# Patient Record
Sex: Male | Born: 1985 | Race: Black or African American | Hispanic: No | Marital: Single | Smoking: Former smoker
Health system: Southern US, Community
[De-identification: ages and names within clinical notes are randomized; demographics above are authoritative.]

## PROBLEM LIST (undated history)

## (undated) DIAGNOSIS — M4310 Spondylolisthesis, site unspecified: Secondary | ICD-10-CM

## (undated) HISTORY — PX: NOSE SURGERY: SHX723

---

## 2006-11-29 ENCOUNTER — Emergency Department: Payer: Self-pay | Admitting: Emergency Medicine

## 2010-10-19 ENCOUNTER — Emergency Department: Payer: Self-pay | Admitting: Emergency Medicine

## 2010-12-23 ENCOUNTER — Emergency Department: Payer: Self-pay | Admitting: Internal Medicine

## 2016-06-17 DIAGNOSIS — N5089 Other specified disorders of the male genital organs: Secondary | ICD-10-CM | POA: Insufficient documentation

## 2016-06-17 DIAGNOSIS — J302 Other seasonal allergic rhinitis: Secondary | ICD-10-CM | POA: Insufficient documentation

## 2019-08-27 ENCOUNTER — Other Ambulatory Visit: Payer: Self-pay | Admitting: *Deleted

## 2019-08-27 DIAGNOSIS — Z20822 Contact with and (suspected) exposure to covid-19: Secondary | ICD-10-CM

## 2019-08-29 LAB — NOVEL CORONAVIRUS, NAA: SARS-CoV-2, NAA: NOT DETECTED

## 2019-09-11 ENCOUNTER — Other Ambulatory Visit: Payer: Self-pay

## 2019-09-11 DIAGNOSIS — Z20822 Contact with and (suspected) exposure to covid-19: Secondary | ICD-10-CM

## 2019-09-12 LAB — NOVEL CORONAVIRUS, NAA: SARS-CoV-2, NAA: NOT DETECTED

## 2019-11-09 ENCOUNTER — Emergency Department
Admission: EM | Admit: 2019-11-09 | Discharge: 2019-11-09 | Disposition: A | Discharge status: Home / Self Care | Payer: Worker's Compensation | Attending: Emergency Medicine | Admitting: Emergency Medicine

## 2019-11-09 ENCOUNTER — Other Ambulatory Visit: Payer: Self-pay

## 2019-11-09 ENCOUNTER — Emergency Department: Payer: Worker's Compensation

## 2019-11-09 ENCOUNTER — Encounter: Payer: Self-pay | Admitting: Emergency Medicine

## 2019-11-09 DIAGNOSIS — Y990 Civilian activity done for income or pay: Secondary | ICD-10-CM | POA: Insufficient documentation

## 2019-11-09 DIAGNOSIS — S39012A Strain of muscle, fascia and tendon of lower back, initial encounter: Secondary | ICD-10-CM | POA: Insufficient documentation

## 2019-11-09 DIAGNOSIS — Z880 Allergy status to penicillin: Secondary | ICD-10-CM | POA: Diagnosis not present

## 2019-11-09 DIAGNOSIS — X500XXA Overexertion from strenuous movement or load, initial encounter: Secondary | ICD-10-CM | POA: Diagnosis not present

## 2019-11-09 DIAGNOSIS — R197 Diarrhea, unspecified: Secondary | ICD-10-CM | POA: Insufficient documentation

## 2019-11-09 DIAGNOSIS — Y9389 Activity, other specified: Secondary | ICD-10-CM | POA: Diagnosis not present

## 2019-11-09 DIAGNOSIS — S3992XA Unspecified injury of lower back, initial encounter: Secondary | ICD-10-CM | POA: Diagnosis present

## 2019-11-09 MED ORDER — OXYCODONE-ACETAMINOPHEN 7.5-325 MG PO TABS: 1.0000 | ORAL_TABLET | Freq: Four times a day (QID) | ORAL | 0 refills | Status: DC | PRN

## 2019-11-09 MED ORDER — DIPHENOXYLATE-ATROPINE 2.5-0.025 MG PO TABS: 1.0000 | ORAL_TABLET | Freq: Four times a day (QID) | ORAL | 0 refills | Status: DC | PRN

## 2019-11-09 MED ORDER — CYCLOBENZAPRINE HCL 10 MG PO TABS: 10.0000 mg | ORAL_TABLET | Freq: Three times a day (TID) | ORAL | 0 refills | Status: DC | PRN

## 2019-11-09 MED ORDER — LIDOCAINE 5 % EX PTCH
2.0000 | MEDICATED_PATCH | CUTANEOUS | Status: DC
  Administered 2019-11-09: 2 via TRANSDERMAL
  Filled 2019-11-09: qty 2

## 2019-11-09 NOTE — ED Notes (Signed)
Patient states not able to go yet ,will try again.

## 2019-11-09 NOTE — ED Triage Notes (Signed)
Says onset back pain when he bent to pick something up at work.  Then diarrhea started.

## 2019-11-09 NOTE — ED Provider Notes (Signed)
[facility] Emergency Department Provider Note   ____________________________________________   First MD Initiated Contact with Patient 11/09/19 1109     (approximate)  I have reviewed the triage vital signs and the nursing notes.   HISTORY  Chief Complaint Back Pain and Diarrhea    HPI Roger Golden is a 34 y.o. male patient presents acute onset of bilateral back pain secondary to flexion lifting incident at work.  Patient states also have diarrhea which started today.  Patient denies radicular component to his back pain.  Patient denies bladder or bowel dysfunction.  Patient stated at home last night with family no one else was having diarrhea.  Patient rates his pain as 8/10.  Patient described the pain as "achy/spasmatic".  No palliative measure for complaints.      History reviewed. No pertinent past medical history.  There are no problems to display for this patient.   History reviewed. No pertinent surgical history.  Prior to Admission medications   Medication Sig Start Date End Date Taking? Authorizing Provider  cyclobenzaprine (FLEXERIL) 10 MG tablet Take 1 tablet (10 mg total) by mouth 3 (three) times daily as needed. 11/09/19   Sable Feil, PA-C  diphenoxylate-atropine (LOMOTIL) 2.5-0.025 MG tablet Take 1 tablet by mouth 4 (four) times daily as needed for diarrhea or loose stools. 11/09/19 11/08/20  Sable Feil, PA-C  oxyCODONE-acetaminophen (PERCOCET) 7.5-325 MG tablet Take 1 tablet by mouth every 6 (six) hours as needed. 11/09/19   Sable Feil, PA-C    Allergies Amoxicillin  No family history on file.  Social History Social History   Tobacco Use  . Smoking status: Never Smoker  . Smokeless tobacco: Never Used  Substance Use Topics  . Alcohol use: Not Currently  . Drug use: Not on file    Review of Systems  Constitutional: No fever/chills Eyes: No visual changes. ENT: No sore throat. Cardiovascular: Denies chest  pain. Respiratory: Denies shortness of breath. Gastrointestinal: No abdominal pain.  No nausea, no vomiting.  2 episodes of loose stools this morning.  No constipation. Genitourinary: Negative for dysuria. Musculoskeletal: Positive for back pain. Skin: Negative for rash. Neurological: Negative for headaches, focal weakness or numbness. Allergic/Immunilogical: Amoxil  ____________________________________________   PHYSICAL EXAM:  VITAL SIGNS: ED Triage Vitals [11/09/19 1057]  Enc Vitals Group     BP      Pulse      Resp      Temp      Temp src      SpO2      Weight 165 lb (74.8 kg)     Height 5\' 11"  (1.803 m)     Head Circumference      Peak Flow      Pain Score 8     Pain Loc      Pain Edu?      Excl. in [place]?     Constitutional: Alert and oriented. Well appearing and in no acute distress. Neck: No stridor.   Cardiovascular: Normal rate, regular rhythm. Grossly normal heart sounds.  Good peripheral circulation. Respiratory: Normal respiratory effort.  No retractions. Lungs CTAB. Gastrointestinal: Soft and nontender. No distention. No abdominal bruits. No CVA tenderness. Musculoskeletal: No obvious spinal deformity.  Patient sits and stands with her reliance on upper extremities.  No guarding palpation of spinal processes.  Patient decreased range of motion with flexion and lateral movements.  Patient has bilateral paraspinal muscle spasm with lateral movements.  No lower extremity tenderness  nor edema.  No joint effusions. Neurologic:  Normal speech and language. No gross focal neurologic deficits are appreciated. No gait instability. Skin:  Skin is warm, dry and intact. No rash noted. Psychiatric: Mood and affect are normal. Speech and behavior are normal.  ____________________________________________   LABS (all labs ordered are listed, but only abnormal results are displayed)  Labs Reviewed - No data to  display ____________________________________________  EKG   ____________________________________________  RADIOLOGY  ED MD interpretation:    Official radiology report(s): DG Lumbar Spine 2-3 Views  Result Date: 11/09/2019 CLINICAL DATA:  Back pain EXAM: LUMBAR SPINE - 2-3 VIEW COMPARISON:  None. FINDINGS: There is no evidence of lumbar spine fracture. Mild retrolisthesis at L4-L5. No significant disc space narrowing or facet hypertrophy. IMPRESSION: No acute osseous abnormality. Electronically Signed   By: Guadlupe Spanish M.D.   On: 11/09/2019 11:57    ____________________________________________   PROCEDURES  Procedure(s) performed (including Critical Care):  Procedures   ____________________________________________   INITIAL IMPRESSION / ASSESSMENT AND PLAN / ED COURSE  As part of my medical decision making, I reviewed the following data within the electronic MEDICAL RECORD NUMBER     Patient presents with bilateral low back pain secondary to a flexion lifting incident at work today.  Discussed neck x-ray findings with patient.  Patient feels exam consistent with lumbar strain.  Patient given discharge care instruction advised take medication directed.  Patient advised [facility] with [facility] if no improvement in 3 days.    Roger Golden was evaluated in Emergency Department on 11/09/2019 for the symptoms described in the history of present illness. He was evaluated in the context of the global COVID-19 pandemic, which necessitated consideration that the patient might be at risk for infection with the SARS-CoV-2 virus that causes COVID-19. Institutional protocols and algorithms that pertain to the evaluation of patients at risk for COVID-19 are in a state of rapid change based on information released by regulatory bodies including the CDC and federal and state organizations. These policies and algorithms were followed during the patient's care in the ED.        ____________________________________________   FINAL CLINICAL IMPRESSION(S) / ED DIAGNOSES  Final diagnoses:  Strain of lumbar region, initial encounter  Diarrhea, unspecified type     ED Discharge Orders         Ordered    oxyCODONE-acetaminophen (PERCOCET) 7.5-325 MG tablet  Every 6 hours PRN     11/09/19 1212    cyclobenzaprine (FLEXERIL) 10 MG tablet  3 times daily PRN     11/09/19 1212    diphenoxylate-atropine (LOMOTIL) 2.5-0.025 MG tablet  4 times daily PRN     11/09/19 1213           Note:  This document was prepared using Dragon voice recognition software and may include unintentional dictation errors.    Joni Reining, PA-C 11/09/19 1222    Dionne Bucy, MD 11/09/19 [phone]

## 2019-11-09 NOTE — Discharge Instructions (Signed)
Follow discharge care instruction take medication as directed. °

## 2019-11-09 NOTE — ED Notes (Addendum)
See triage note Presents with lower back pain after bending to lift something  States he felt a sharp pain  Became nauseated  And then add an episode of diarrhea

## 2019-11-09 NOTE — ED Notes (Signed)
Patient unable to urinate at this time give patient a cup of water will check again.

## 2019-11-11 ENCOUNTER — Other Ambulatory Visit: Payer: Self-pay

## 2019-11-11 ENCOUNTER — Emergency Department: Payer: Worker's Compensation

## 2019-11-11 ENCOUNTER — Emergency Department
Admission: EM | Admit: 2019-11-11 | Discharge: 2019-11-11 | Disposition: A | Discharge status: Home / Self Care | Payer: Worker's Compensation | Attending: Student | Admitting: Student

## 2019-11-11 DIAGNOSIS — R1033 Periumbilical pain: Secondary | ICD-10-CM | POA: Insufficient documentation

## 2019-11-11 DIAGNOSIS — R112 Nausea with vomiting, unspecified: Secondary | ICD-10-CM | POA: Diagnosis not present

## 2019-11-11 DIAGNOSIS — R197 Diarrhea, unspecified: Secondary | ICD-10-CM | POA: Diagnosis not present

## 2019-11-11 DIAGNOSIS — Z20822 Contact with and (suspected) exposure to covid-19: Secondary | ICD-10-CM | POA: Insufficient documentation

## 2019-11-11 LAB — COMPREHENSIVE METABOLIC PANEL
ALT: 25 U/L (ref 0–44)
AST: 15 U/L (ref 15–41)
Albumin: 4.6 g/dL (ref 3.5–5.0)
Alkaline Phosphatase: 66 U/L (ref 38–126)
Anion gap: 8 (ref 5–15)
BUN: 14 mg/dL (ref 6–20)
CO2: 26 mmol/L (ref 22–32)
Calcium: 9.2 mg/dL (ref 8.9–10.3)
Chloride: 103 mmol/L (ref 98–111)
Creatinine, Ser: 0.97 mg/dL (ref 0.61–1.24)
GFR calc Af Amer: >60 mL/min (ref >60)
GFR calc non Af Amer: >60 mL/min (ref >60)
Glucose, Bld: 118 mg/dL — ABNORMAL HIGH (ref 70–99)
Potassium: 4.7 mmol/L (ref 3.5–5.1)
Sodium: 137 mmol/L (ref 135–145)
Total Bilirubin: 0.8 mg/dL (ref 0.3–1.2)
Total Protein: 7.4 g/dL (ref 6.5–8.1)

## 2019-11-11 LAB — CBC
HCT: 48.9 % (ref 39.0–52.0)
Hemoglobin: 16.6 g/dL (ref 13.0–17.0)
MCH: 30.7 pg (ref 26.0–34.0)
MCHC: 33.9 g/dL (ref 30.0–36.0)
MCV: 90.4 fL (ref 80.0–100.0)
Platelets: 254 10*3/uL (ref 150–400)
RBC: 5.41 MIL/uL (ref 4.22–5.81)
RDW: 12.4 % (ref 11.5–15.5)
WBC: 14.5 10*3/uL — ABNORMAL HIGH (ref 4.0–10.5)
nRBC: 0 % (ref 0.0–0.2)

## 2019-11-11 LAB — LIPASE, BLOOD: Lipase: 23 U/L (ref 11–51)

## 2019-11-11 MED ORDER — IOHEXOL 300 MG/ML  SOLN
100.0000 mL | Freq: Once | INTRAMUSCULAR | Status: AC | PRN
  Administered 2019-11-11: 100 mL via INTRAVENOUS

## 2019-11-11 MED ORDER — ONDANSETRON HCL 4 MG/2ML IJ SOLN
4.0000 mg | Freq: Once | INTRAMUSCULAR | Status: AC
  Administered 2019-11-11: 17:00:00 4 mg via INTRAVENOUS
  Filled 2019-11-11: qty 2

## 2019-11-11 MED ORDER — SODIUM CHLORIDE 0.9 % IV BOLUS
1000.0000 mL | Freq: Once | INTRAVENOUS | Status: AC
  Administered 2019-11-11: 1000 mL via INTRAVENOUS

## 2019-11-11 MED ORDER — ONDANSETRON HCL 4 MG PO TABS: 4.0000 mg | ORAL_TABLET | Freq: Three times a day (TID) | ORAL | 0 refills | Status: DC | PRN

## 2019-11-11 MED ORDER — SODIUM CHLORIDE 0.9% FLUSH: 3.0000 mL | Freq: Once | INTRAVENOUS | Status: DC

## 2019-11-11 NOTE — ED Notes (Signed)
Pt taken for CT scan 

## 2019-11-11 NOTE — ED Triage Notes (Signed)
Pt c/o generalized abd pain with N/V/D for the past week and tested negative for covid in the past week.

## 2019-11-11 NOTE — ED Notes (Signed)
Pt states he has had n/v for the last 2 weeks- pt states that he has been bloated and when he burps it "smells like sulfur"- pt states that he has not eaten anything today

## 2019-11-11 NOTE — Discharge Instructions (Signed)
Thank you for letting us take care of you in the emergency department today.  At this time, your CT scan did not show any acute abnormalities that require you to stay in the hospital.  We will send you home with a prescription to help with nausea.  We also advise some dietary adjustments at this time (see the attachment papers), as well as obtaining some over-the-counter antidiarrheal locations (such as Pepto-Bismol), and probiotics, and take as directed.  We have placed a referral, information below, for follow-up with a GI doctor for further work-up and evaluation of your symptoms.  Please call to make an appointment.  Please return to the emergency department for any new or worsening symptoms.

## 2019-11-11 NOTE — ED Provider Notes (Signed)
[facility] Emergency Department Provider Note  ____________________________________________   First MD Initiated Contact with Patient 11/11/19 1620     (approximate)  I have reviewed the triage vital signs and the nursing notes.  History  Chief Complaint Abdominal Pain    HPI Roger Golden is a 34 y.o. male with no significant medical history who presents to the emergency department for ongoing abdominal pain, nausea, vomiting, diarrhea.  Patient states symptoms first started about a week and a half ago.  Initially developed central abdominal pain, along with vomiting and diarrhea.  Pain is intermittent and comes and goes without any identifiable inciting trigger.  Sharp, severe, located periumbilically.  No radiation, no alleviating or aggravating factors.  At the initial onset of his symptoms he had associated nonbloody nonbilious emesis.  This seemed to improve briefly, however now he still has emesis with attempted PO and has had severely decreased intake as a result.  He feels like he has lost weight because of this.  Over the last week and a half he has had persistent diarrhea, at least several episodes daily, also nonbloody, no melena or hematochezia.  He denies any alcohol, tobacco, marijuana use.  Denies any history of IBD or family history of this.  No testicular pain or swelling, no dysuria, no discharge.   Past Medical Hx History reviewed. No pertinent past medical history.  Problem List There are no problems to display for this patient.   Past Surgical Hx History reviewed. No pertinent surgical history.  Medications Prior to Admission medications   Medication Sig Start Date End Date Taking? Authorizing Provider  cyclobenzaprine (FLEXERIL) 10 MG tablet Take 1 tablet (10 mg total) by mouth 3 (three) times daily as needed. 11/09/19   Sable Feil, PA-C  diphenoxylate-atropine (LOMOTIL) 2.5-0.025 MG tablet Take 1 tablet by mouth 4 (four) times  daily as needed for diarrhea or loose stools. 11/09/19 11/08/20  Sable Feil, PA-C  oxyCODONE-acetaminophen (PERCOCET) 7.5-325 MG tablet Take 1 tablet by mouth every 6 (six) hours as needed. 11/09/19   Sable Feil, PA-C    Allergies Amoxicillin  Family Hx No family history on file.  Social Hx Social History   Tobacco Use  . Smoking status: Never Smoker  . Smokeless tobacco: Never Used  Substance Use Topics  . Alcohol use: Not Currently  . Drug use: Not on file     Review of Systems  Constitutional: Negative for fever, chills. Eyes: Negative for visual changes. ENT: Negative for sore throat. Cardiovascular: Negative for chest pain. Respiratory: Negative for shortness of breath. Gastrointestinal: Positive for nausea, vomiting, diarrhea, abdominal pain.  Genitourinary: Negative for dysuria. Musculoskeletal: Negative for leg swelling. Skin: Negative for rash. Neurological: Negative for headaches.   Physical Exam  Vital Signs: ED Triage Vitals  Enc Vitals Group     BP 11/11/19 1410 123/90     Pulse Rate 11/11/19 1410 (!) 107     Resp 11/11/19 1410 16     Temp 11/11/19 1410 98.7 F (37.1 C)     Temp Source 11/11/19 1410 Oral     SpO2 11/11/19 1410 97 %     Weight 11/11/19 1352 165 lb (74.8 kg)     Height 11/11/19 1352 5\' 11"  (1.803 m)     Head Circumference --      Peak Flow --      Pain Score 11/11/19 1352 7     Pain Loc --      Pain  Edu? --      Excl. in [place]? --     Constitutional: Alert and oriented.  Head: Normocephalic. Atraumatic. Eyes: Conjunctivae clear. Sclera anicteric. Nose: No congestion. No rhinorrhea. Mouth/Throat: Wearing mask.  Neck: No stridor.   Cardiovascular: Normal rate, regular rhythm. Extremities well perfused. Respiratory: Normal respiratory effort.  Lungs CTAB. Gastrointestinal: Soft.  Mild periumbilical abdominal pain, no rebound or guarding. Non-distended.  Musculoskeletal: No lower extremity edema. No deformities. Neurologic:   Normal speech and language. No gross focal neurologic deficits are appreciated.  Skin: Skin is warm, dry and intact. No rash noted. Psychiatric: Mood and affect are appropriate for situation.   Radiology  CT: IMPRESSION:  1. No acute abnormalities involving the abdomen or pelvis.  2. Minimal atherosclerosis involving the LEFT common iliac artery at  its origin, advanced for patient age.   Stomach/Bowel: Stomach normal in appearance for the degree of  distention. Normal-appearing small bowel. Liquid stool throughout  normal appearing colon from cecum to rectum. No focal abnormality  involving the colon. Normal appendix in the RIGHT mid pelvis.    Procedures  Procedure(s) performed (including critical care):  Procedures   Initial Impression / Assessment and Plan / ED Course  34 y.o. male who presents to the ED for abdominal pain, vomiting, diarrhea, as above.  Ddx: COVID, colitis, IBD, other GI viral process  Will evaluate with labs, imaging.  CT without acute abnormalities in the abdomen or pelvis, though liquid stool is noted throughout normal appearing colon from cecum to rectum.  Will advise over-the-counter probiotics, antidiarrheals, dietary adjustment and follow-up with GI. Patient voices understanding and is agreeable. Will perform send out COVID swab prior to discharge. Given return precautions.    Final Clinical Impression(s) / ED Diagnosis  Final diagnoses:  Periumbilical abdominal pain  Nausea, vomiting, and diarrhea       Note:  This document was prepared using Dragon voice recognition software and may include unintentional dictation errors.   Miguel Aschoff., MD 11/11/19 [phone]

## 2019-11-13 ENCOUNTER — Other Ambulatory Visit: Payer: Self-pay

## 2019-11-13 ENCOUNTER — Encounter: Payer: Self-pay | Admitting: Gastroenterology

## 2019-11-13 ENCOUNTER — Ambulatory Visit: Payer: PRIVATE HEALTH INSURANCE | Admitting: Gastroenterology

## 2019-11-13 VITALS — BP 130/79 | HR 99 | Temp 98.3°F | Ht 70.0 in | Wt 157.0 lb

## 2019-11-13 DIAGNOSIS — R112 Nausea with vomiting, unspecified: Secondary | ICD-10-CM

## 2019-11-13 DIAGNOSIS — R1013 Epigastric pain: Secondary | ICD-10-CM

## 2019-11-13 DIAGNOSIS — R1010 Upper abdominal pain, unspecified: Secondary | ICD-10-CM | POA: Diagnosis not present

## 2019-11-13 LAB — NOVEL CORONAVIRUS, NAA (HOSP ORDER, SEND-OUT TO REF LAB; TAT 18-24 HRS): SARS-CoV-2, NAA: NOT DETECTED

## 2019-11-13 NOTE — Progress Notes (Addendum)
Arlyss Repress, MD [address]  [address]  [place], [place] [address]  Main: [phone]  Fax: [phone]    Gastroenterology Consultation  Referring Provider:     No ref. provider found Primary Care Physician:  Patient, No Pcp Per Primary Gastroenterologist:  Dr. Arlyss Repress Reason for Consultation: Nausea, vomiting, abdominal pain        HPI:   Roger Golden is a 34 y.o. male referred from ER for consultation & management of acute onset cough nausea, vomiting and abdominal pain Patient presented to ER on 11/11/2019 secondary to approximately 2 weeks history of upper/periumbilical abdominal pain, associated with nausea, vomiting and diarrhea.  Abdominal pain is mostly central associated with nonbloody nonbilious emesis,.  His diarrheal episodes were several daily.  He denies alcohol, tobacco and marijuana use.  His labs revealed mild leukocytosis, normal CMP and lipase.  CT abdomen and pelvis with contrast was unremarkable.  Patient was advised to try over-the-counter probiotics, antidiarrheals with dietary adjustment and referred to GI for further evaluation.  COVID-19 test came back negative.  Since patient was discharged from the ER, he did not have any bowel movement.  He does report feeling gassy, bloating, symptoms trigger with spicy foods.  He does feel nauseous.  He also lost about 11 pounds in last 2 weeks as he is not able to tolerate regular food.  He does eat outside about 2-3 times a week His mother had Helicobacter pylori infection, ? IBD Patient denies smoking or alcohol use He works as an Publishing copy   NSAIDs: None  Antiplts/Anticoagulants/Anti thrombotics: None  GI Procedures: None  History reviewed. No pertinent past medical history.  History reviewed. No pertinent surgical history.   Current Outpatient Medications:  .  cyclobenzaprine (FLEXERIL) 10 MG tablet, Take 1 tablet (10 mg total) by mouth 3 (three) times daily as needed., Disp:  15 tablet, Rfl: 0   History reviewed. No pertinent family history.   Social History   Tobacco Use  . Smoking status: Never Smoker  . Smokeless tobacco: Never Used  Substance Use Topics  . Alcohol use: Not Currently  . Drug use: Not on file    Allergies as of 11/13/2019 - Review Complete 11/13/2019  Allergen Reaction Noted  . Amoxicillin Hives 06/17/2016    Review of Systems:    All systems reviewed and negative except where noted in HPI.   Physical Exam:  BP 130/79 (BP Location: Left Arm, Patient Position: Sitting, Cuff Size: Normal)   Pulse 99   Temp 98.3 F (36.8 C) (Oral)   Ht 5\' 10"  (1.778 m)   Wt 157 lb (71.2 kg)   BMI 22.53 kg/m  No LMP for male patient.  General:   Alert, thin built, pleasant and cooperative in NAD Head:  Normocephalic and atraumatic. Eyes:  Sclera clear, no icterus.   Conjunctiva pink. Ears:  Normal auditory acuity. Nose:  No deformity, discharge, or lesions. Mouth:  No deformity or lesions,oropharynx pink & moist. Neck:  Supple; no masses or thyromegaly. Lungs:  Respirations even and unlabored.  Clear throughout to auscultation.   No wheezes, crackles, or rhonchi. No acute distress. Heart:  Regular rate and rhythm; no murmurs, clicks, rubs, or gallops. Abdomen:  Normal bowel sounds. Soft, non-tender and non-distended without masses, hepatosplenomegaly or hernias noted.  No guarding or rebound tenderness.   Rectal: Not performed Msk:  Symmetrical without gross deformities. Good, equal movement & strength bilaterally. Pulses:  Normal pulses noted. Extremities:  No  clubbing or edema.  No cyanosis. Neurologic:  Alert and oriented x3;  grossly normal neurologically. Skin:  Intact without significant lesions or rashes. No jaundice. Psych:  Alert and cooperative. Normal mood and affect.  Imaging Studies: Reviewed  Assessment and Plan:   Roger Golden is a 34 y.o. male with no significant past medical history is seen in consultation for 2  weeks history of upper abdominal pain, nausea, vomiting and nonbloody diarrhea.  CT abdomen and pelvis did not reveal any acute intra-abdominal pathology.  Labs revealed mild leukocytosis  Recommend H. pylori breath test and treat if positive If H. pylori test is negative, given his significant weight loss, recommend bidirectional endoscopy and patient is agreeable Encouraged him to eat mostly protein-based foods, avoid spicy foods and protein supplements 1-2 times daily  Follow up in 1 month   Cephas Darby, MD

## 2019-11-14 LAB — H. PYLORI BREATH TEST: H pylori Breath Test: NEGATIVE

## 2019-11-15 ENCOUNTER — Encounter: Payer: Self-pay | Admitting: Gastroenterology

## 2019-11-15 ENCOUNTER — Other Ambulatory Visit: Payer: Self-pay

## 2019-11-15 ENCOUNTER — Telehealth: Payer: Self-pay | Admitting: Gastroenterology

## 2019-11-15 DIAGNOSIS — R112 Nausea with vomiting, unspecified: Secondary | ICD-10-CM

## 2019-11-15 DIAGNOSIS — R197 Diarrhea, unspecified: Secondary | ICD-10-CM

## 2019-11-15 MED ORDER — NA SULFATE-K SULFATE-MG SULF 17.5-3.13-1.6 GM/177ML PO SOLN: 354.0000 mL | Freq: Once | ORAL | 0 refills | Status: AC

## 2019-11-15 NOTE — Telephone Encounter (Signed)
He is not doing well.  Please schedule for urgent EGD and colonoscopy Recommend further labs GI profile PCR, CRP, fecal calprotectin levels  Thanks RV

## 2019-11-15 NOTE — Telephone Encounter (Signed)
Roger Golden (girlfriend)called stating patient is sick with watery diarrhea ,bloating and burps smelling like sulfa. Please advise.

## 2019-11-15 NOTE — Addendum Note (Signed)
Addended by: Radene Knee L on: 11/15/2019 11:12 AM   Modules accepted: Orders

## 2019-11-15 NOTE — Telephone Encounter (Signed)
Order the labs.  

## 2019-11-15 NOTE — Telephone Encounter (Signed)
Called patient wife they are going to do labs today. Scheduled patient for colonoscopy and EGD for 11/20/2019. Went over instructions sent them to [employer]

## 2019-11-16 ENCOUNTER — Other Ambulatory Visit
Admission: RE | Admit: 2019-11-16 | Discharge: 2019-11-16 | Disposition: A | Discharge status: Home / Self Care | Payer: PRIVATE HEALTH INSURANCE | Source: Ambulatory Visit | Attending: Gastroenterology | Admitting: Gastroenterology

## 2019-11-16 DIAGNOSIS — Z01812 Encounter for preprocedural laboratory examination: Secondary | ICD-10-CM | POA: Insufficient documentation

## 2019-11-16 DIAGNOSIS — Z20822 Contact with and (suspected) exposure to covid-19: Secondary | ICD-10-CM | POA: Insufficient documentation

## 2019-11-16 LAB — C-REACTIVE PROTEIN: CRP: 1 mg/L (ref 0–10)

## 2019-11-16 NOTE — Discharge Instructions (Signed)
General Anesthesia, Adult, Care After This sheet gives you information about how to care for yourself after your procedure. Your health care provider may also give you more specific instructions. If you have problems or questions, contact your health care provider. What can I expect after the procedure? After the procedure, the following side effects are common:  Pain or discomfort at the IV site.  Nausea.  Vomiting.  Sore throat.  Trouble concentrating.  Feeling cold or chills.  Weak or tired.  Sleepiness and fatigue.  Soreness and body aches. These side effects can affect parts of the body that were not involved in surgery. Follow these instructions at home:  For at least 24 hours after the procedure:  Have a responsible adult stay with you. It is important to have someone help care for you until you are awake and alert.  Rest as needed.  Do not: ? Participate in activities in which you could fall or become injured. ? Drive. ? Use heavy machinery. ? Drink alcohol. ? Take sleeping pills or medicines that cause drowsiness. ? Make important decisions or sign legal documents. ? Take care of children on your own. Eating and drinking  Follow any instructions from your health care provider about eating or drinking restrictions.  When you feel hungry, start by eating small amounts of foods that are soft and easy to digest (bland), such as toast. Gradually return to your regular diet.  Drink enough fluid to keep your urine pale yellow.  If you vomit, rehydrate by drinking water, juice, or clear broth. General instructions  If you have sleep apnea, surgery and certain medicines can increase your risk for breathing problems. Follow instructions from your health care provider about wearing your sleep device: ? Anytime you are sleeping, including during daytime naps. ? While taking prescription pain medicines, sleeping medicines, or medicines that make you drowsy.  Return to  your normal activities as told by your health care provider. Ask your health care provider what activities are safe for you.  Take over-the-counter and prescription medicines only as told by your health care provider.  If you smoke, do not smoke without supervision.  Keep all follow-up visits as told by your health care provider. This is important. Contact a health care provider if:  You have nausea or vomiting that does not get better with medicine.  You cannot eat or drink without vomiting.  You have pain that does not get better with medicine.  You are unable to pass urine.  You develop a skin rash.  You have a fever.  You have redness around your IV site that gets worse. Get help right away if:  You have difficulty breathing.  You have chest pain.  You have blood in your urine or stool, or you vomit blood. Summary  After the procedure, it is common to have a sore throat or nausea. It is also common to feel tired.  Have a responsible adult stay with you for the first 24 hours after general anesthesia. It is important to have someone help care for you until you are awake and alert.  When you feel hungry, start by eating small amounts of foods that are soft and easy to digest (bland), such as toast. Gradually return to your regular diet.  Drink enough fluid to keep your urine pale yellow.  Return to your normal activities as told by your health care provider. Ask your health care provider what activities are safe for you. This information is not   intended to replace advice given to you by your health care provider. Make sure you discuss any questions you have with your health care provider. Document Revised: 09/23/2017 Document Reviewed: 05/06/2017 Elsevier Patient Education  2020 [employer]  

## 2019-11-17 LAB — SARS CORONAVIRUS 2 (TAT 6-24 HRS): SARS Coronavirus 2: NEGATIVE

## 2019-11-20 ENCOUNTER — Other Ambulatory Visit: Payer: Self-pay

## 2019-11-20 ENCOUNTER — Other Ambulatory Visit: Payer: Self-pay | Admitting: Gastroenterology

## 2019-11-20 ENCOUNTER — Encounter: Payer: Self-pay | Admitting: Gastroenterology

## 2019-11-20 ENCOUNTER — Ambulatory Visit: Payer: PRIVATE HEALTH INSURANCE | Admitting: Anesthesiology

## 2019-11-20 ENCOUNTER — Ambulatory Visit
Admission: RE | Admit: 2019-11-20 | Discharge: 2019-11-20 | Disposition: A | Discharge status: Home / Self Care | Payer: PRIVATE HEALTH INSURANCE | Attending: Gastroenterology | Admitting: Gastroenterology

## 2019-11-20 ENCOUNTER — Encounter
Admission: RE | Disposition: A | Discharge status: Home / Self Care | Payer: Self-pay | Source: Home / Self Care | Attending: Gastroenterology

## 2019-11-20 DIAGNOSIS — K644 Residual hemorrhoidal skin tags: Secondary | ICD-10-CM | POA: Diagnosis not present

## 2019-11-20 DIAGNOSIS — Z87891 Personal history of nicotine dependence: Secondary | ICD-10-CM | POA: Diagnosis not present

## 2019-11-20 DIAGNOSIS — R112 Nausea with vomiting, unspecified: Secondary | ICD-10-CM | POA: Diagnosis not present

## 2019-11-20 DIAGNOSIS — R197 Diarrhea, unspecified: Secondary | ICD-10-CM | POA: Diagnosis not present

## 2019-11-20 DIAGNOSIS — R1010 Upper abdominal pain, unspecified: Secondary | ICD-10-CM

## 2019-11-20 DIAGNOSIS — K642 Third degree hemorrhoids: Secondary | ICD-10-CM | POA: Insufficient documentation

## 2019-11-20 DIAGNOSIS — R1033 Periumbilical pain: Secondary | ICD-10-CM | POA: Insufficient documentation

## 2019-11-20 DIAGNOSIS — Z6822 Body mass index (BMI) 22.0-22.9, adult: Secondary | ICD-10-CM | POA: Insufficient documentation

## 2019-11-20 DIAGNOSIS — Z880 Allergy status to penicillin: Secondary | ICD-10-CM | POA: Insufficient documentation

## 2019-11-20 DIAGNOSIS — R634 Abnormal weight loss: Secondary | ICD-10-CM | POA: Diagnosis not present

## 2019-11-20 DIAGNOSIS — R1110 Vomiting, unspecified: Secondary | ICD-10-CM

## 2019-11-20 HISTORY — DX: Spondylolisthesis, site unspecified: M43.10

## 2019-11-20 HISTORY — PX: ESOPHAGOGASTRODUODENOSCOPY (EGD) WITH PROPOFOL: SHX5813

## 2019-11-20 HISTORY — PX: COLONOSCOPY WITH PROPOFOL: SHX5780

## 2019-11-20 HISTORY — PX: BIOPSY: SHX5522

## 2019-11-20 SURGERY — COLONOSCOPY WITH PROPOFOL
Anesthesia: General | Site: Throat

## 2019-11-20 MED ORDER — ONDANSETRON HCL 4 MG/2ML IJ SOLN: 4.0000 mg | Freq: Once | INTRAMUSCULAR | Status: DC | PRN

## 2019-11-20 MED ORDER — LACTATED RINGERS IV SOLN: INTRAVENOUS | Status: DC

## 2019-11-20 MED ORDER — LIDOCAINE HCL (CARDIAC) PF 100 MG/5ML IV SOSY
PREFILLED_SYRINGE | INTRAVENOUS | Status: DC | PRN
  Administered 2019-11-20: 50 mg via INTRAVENOUS

## 2019-11-20 MED ORDER — PROPOFOL 10 MG/ML IV BOLUS
INTRAVENOUS | Status: DC | PRN
  Administered 2019-11-20: 30 mg via INTRAVENOUS
  Administered 2019-11-20 (×2): 20 mg via INTRAVENOUS
  Administered 2019-11-20: 100 mg via INTRAVENOUS
  Administered 2019-11-20: 50 mg via INTRAVENOUS
  Administered 2019-11-20 (×2): 30 mg via INTRAVENOUS
  Administered 2019-11-20: 20 mg via INTRAVENOUS
  Administered 2019-11-20 (×2): 30 mg via INTRAVENOUS
  Administered 2019-11-20: 20 mg via INTRAVENOUS
  Administered 2019-11-20: 40 mg via INTRAVENOUS
  Administered 2019-11-20: 20 mg via INTRAVENOUS
  Administered 2019-11-20 (×3): 30 mg via INTRAVENOUS

## 2019-11-20 MED ORDER — ONDANSETRON HCL 4 MG PO TABS: 4.0000 mg | ORAL_TABLET | Freq: Three times a day (TID) | ORAL | 0 refills | Status: DC | PRN

## 2019-11-20 MED ORDER — GLYCOPYRROLATE 0.2 MG/ML IJ SOLN
INTRAMUSCULAR | Status: DC | PRN
  Administered 2019-11-20: .1 mg via INTRAVENOUS

## 2019-11-20 MED ORDER — STERILE WATER FOR IRRIGATION IR SOLN
Status: DC | PRN
  Administered 2019-11-20: 150 mL

## 2019-11-20 MED ORDER — DICYCLOMINE HCL 10 MG PO CAPS: 10.0000 mg | ORAL_CAPSULE | Freq: Three times a day (TID) | ORAL | 0 refills | Status: DC

## 2019-11-20 SURGICAL SUPPLY — 37 items
BALLN DILATOR 10-12 8 (BALLOONS)
BALLN DILATOR 12-15 8 (BALLOONS)
BALLN DILATOR 15-18 8 (BALLOONS)
BALLN DILATOR CRE 0-12 8 (BALLOONS)
BALLN DILATOR ESOPH 8 10 CRE (MISCELLANEOUS) IMPLANT
BALLOON DILATOR 12-15 8 (BALLOONS) IMPLANT
BALLOON DILATOR 15-18 8 (BALLOONS) IMPLANT
BALLOON DILATOR CRE 0-12 8 (BALLOONS) IMPLANT
BLOCK BITE 60FR ADLT L/F GRN (MISCELLANEOUS) ×4 IMPLANT
CANISTER SUCT 1200ML W/VALVE (MISCELLANEOUS) ×4 IMPLANT
CLIP HMST 235XBRD CATH ROT (MISCELLANEOUS) IMPLANT
CLIP RESOLUTION 360 11X235 (MISCELLANEOUS)
ELECT REM PT RETURN 9FT ADLT (ELECTROSURGICAL)
ELECTRODE REM PT RTRN 9FT ADLT (ELECTROSURGICAL) IMPLANT
FCP ESCP3.2XJMB 240X2.8X (MISCELLANEOUS) ×2
FORCEPS BIOP RAD 4 LRG CAP 4 (CUTTING FORCEPS) IMPLANT
FORCEPS BIOP RJ4 240 W/NDL (MISCELLANEOUS) ×2
FORCEPS ESCP3.2XJMB 240X2.8X (MISCELLANEOUS) ×2 IMPLANT
GOWN CVR UNV OPN BCK APRN NK (MISCELLANEOUS) ×4 IMPLANT
GOWN ISOL THUMB LOOP REG UNIV (MISCELLANEOUS) ×4
INJECTOR VARIJECT VIN23 (MISCELLANEOUS) IMPLANT
KIT DEFENDO VALVE AND CONN (KITS) IMPLANT
KIT ENDO PROCEDURE OLY (KITS) ×4 IMPLANT
MARKER SPOT ENDO TATTOO 5ML (MISCELLANEOUS) IMPLANT
PROBE APC STR FIRE (PROBE) IMPLANT
RETRIEVER NET PLAT FOOD (MISCELLANEOUS) IMPLANT
RETRIEVER NET ROTH 2.5X230 LF (MISCELLANEOUS) ×4 IMPLANT
SNARE COLD EXACTO (MISCELLANEOUS) IMPLANT
SNARE SHORT THROW 13M SML OVAL (MISCELLANEOUS) IMPLANT
SNARE SHORT THROW 30M LRG OVAL (MISCELLANEOUS) IMPLANT
SNARE SNG USE RND 15MM (INSTRUMENTS) IMPLANT
SPOT EX ENDOSCOPIC TATTOO (MISCELLANEOUS)
SYR INFLATION 60ML (SYRINGE) IMPLANT
TRAP ETRAP POLY (MISCELLANEOUS) IMPLANT
VARIJECT INJECTOR VIN23 (MISCELLANEOUS)
WATER STERILE IRR 250ML POUR (IV SOLUTION) ×4 IMPLANT
WIRE CRE 18-20MM 8CM F G (MISCELLANEOUS) IMPLANT

## 2019-11-20 NOTE — Anesthesia Procedure Notes (Signed)
Performed by: Trino Higinbotham, [place], CRNA Pre-anesthesia Checklist: Patient identified, Emergency Drugs available, Suction available, Timeout performed and Patient being monitored Patient Re-evaluated:Patient Re-evaluated prior to induction Oxygen Delivery Method: Nasal cannula Placement Confirmation: positive ETCO2       

## 2019-11-20 NOTE — Transfer of Care (Signed)
Immediate Anesthesia Transfer of Care Note  Patient: Roger Golden  Procedure(s) Performed: COLONOSCOPY WITH PROPOFOL (N/A Rectum) ESOPHAGOGASTRODUODENOSCOPY (EGD) WITH PROPOFOL (N/A Throat) BIOPSY (N/A Throat)  Patient Location: PACU  Anesthesia Type: General  Level of Consciousness: awake, alert  and patient cooperative  Airway and Oxygen Therapy: Patient Spontanous Breathing and Patient connected to supplemental oxygen  Post-op Assessment: Post-op Vital signs reviewed, Patient's Cardiovascular Status Stable, Respiratory Function Stable, Patent Airway and No signs of Nausea or vomiting  Post-op Vital Signs: Reviewed and stable  Complications: No apparent anesthesia complications

## 2019-11-20 NOTE — Op Note (Signed)
[facility] Gastroenterology Patient Name: Roger Golden Procedure Date: 11/20/2019 3:55 PM MRN: [id number] Account #: [id number] Date of Birth: [birth year] Admit Type: Outpatient Age: 34 Room: [facility] OR ROOM 01 Gender: Male Note Status: Finalized Procedure:             Upper GI endoscopy Indications:           Periumbilical abdominal pain, Anorexia, Diarrhea,                         Nausea with vomiting, Weight loss Providers:             Lin Landsman MD, MD Referring MD:          Rubbie Battiest. Iona Beard MD, MD (Referring MD) Medicines:             Monitored Anesthesia Care Complications:         No immediate complications. Estimated blood loss: None. Procedure:             Pre-Anesthesia Assessment:                        - Prior to the procedure, a History and Physical was                         performed, and patient medications and allergies were                         reviewed. The patient is competent. The risks and                         benefits of the procedure and the sedation options and                         risks were discussed with the patient. All questions                         were answered and informed consent was obtained.                         Patient identification and proposed procedure were                         verified by the physician, the nurse, the                         anesthesiologist, the anesthetist and the technician                         in the pre-procedure area in the procedure room in the                         endoscopy suite. Mental Status Examination: alert and                         oriented. Airway Examination: normal oropharyngeal                         airway and neck mobility. Respiratory Examination:  clear to auscultation. CV Examination: normal.                         Prophylactic Antibiotics: The patient does not require                         prophylactic antibiotics. Prior  Anticoagulants: The                         patient has taken no previous anticoagulant or                         antiplatelet agents. ASA Grade Assessment: II - A                         patient with mild systemic disease. After reviewing                         the risks and benefits, the patient was deemed in                         satisfactory condition to undergo the procedure. The                         anesthesia plan was to use monitored anesthesia care                         (MAC). Immediately prior to administration of                         medications, the patient was re-assessed for adequacy                         to receive sedatives. The heart rate, respiratory                         rate, oxygen saturations, blood pressure, adequacy of                         pulmonary ventilation, and response to care were                         monitored throughout the procedure. The physical                         status of the patient was re-assessed after the                         procedure.                        After obtaining informed consent, the endoscope was                         passed under direct vision. Throughout the procedure,                         the patient's blood pressure, pulse, and oxygen  saturations were monitored continuously. The                         Endosonoscope was introduced through the mouth, and                         advanced to the second part of duodenum. The upper GI                         endoscopy was accomplished without difficulty. The                         patient tolerated the procedure well. Findings:      The esophagus, gastroesophageal junction and examined esophagus were       normal.      The stomach was normal. Biopsies were taken with a cold forceps for       Helicobacter pylori testing.      The examined duodenum was normal. Biopsies for histology were taken with       a cold forceps for  evaluation of celiac disease. Impression:            - Normal esophagus and gastroesophageal junction.                        - Normal stomach. Biopsied.                        - Normal examined duodenum. Biopsied. Recommendation:        - Await pathology results.                        - Proceed with colonoscopy as scheduled                        See colonoscopy report Procedure Code(s):     --- Professional ---                        [phone], Esophagogastroduodenoscopy, flexible,                         transoral; with biopsy, single or multiple Diagnosis Code(s):     --- Professional ---                        L37.34, Periumbilical pain                        R63.0, Anorexia                        R19.7, Diarrhea, unspecified                        R11.2, Nausea with vomiting, unspecified                        R63.4, Abnormal weight loss CPT copyright 2019 American Medical Association. All rights reserved. The codes documented in this report are preliminary and upon coder review may  be revised to meet current compliance requirements. Dr. Ulyess Mort Lin Landsman MD, MD 11/20/2019 4:10:41 PM This report has been signed electronically.  Number of Addenda: 0 Note Initiated On: 11/20/2019 3:55 PM Total Procedure Duration: 0 hours 5 minutes 47 seconds  Estimated Blood Loss:  Estimated blood loss: none.      [facility]

## 2019-11-20 NOTE — H&P (Signed)
Cephas Darby, MD [address]  [place]  [place], [place] [address]  Main: [phone]  Fax: [phone] Pager: [phone]  Primary Care Physician:  Sharyne Peach, MD Primary Gastroenterologist:  Dr. Cephas Darby  Pre-Procedure History & Physical: HPI:  Roger Golden is a 34 y.o. male is here for an endoscopy and colonoscopy.   Past Medical History:  Diagnosis Date  . Retrolisthesis    L4-L5    Past Surgical History:  Procedure Laterality Date  . NOSE SURGERY      Prior to Admission medications   Medication Sig Start Date End Date Taking? Authorizing Provider  cyclobenzaprine (FLEXERIL) 10 MG tablet Take 1 tablet (10 mg total) by mouth 3 (three) times daily as needed. 11/09/19  Yes Sable Feil, PA-C  diphenoxylate-atropine (LOMOTIL) 2.5-0.025 MG tablet Take by mouth 4 (four) times daily as needed for diarrhea or loose stools.    [provider]  oxyCODONE-acetaminophen (PERCOCET) 7.5-325 MG tablet Take 1 tablet by mouth every 6 (six) hours as needed for severe pain.    [provider]    Allergies as of 11/15/2019 - Review Complete 11/15/2019  Allergen Reaction Noted  . Amoxicillin Hives 06/17/2016    History reviewed. No pertinent family history.  Social History   Socioeconomic History  . Marital status: Single    Spouse name: Not on file  . Number of children: Not on file  . Years of education: Not on file  . Highest education level: Not on file  Occupational History  . Not on file  Tobacco Use  . Smoking status: Former Smoker    Packs/day: 0.25    Years: 10.00    Pack years: 2.50    Types: Cigarettes    Quit date: 10/04/2019    Years since quitting: 0.1  . Smokeless tobacco: Never Used  Substance and Sexual Activity  . Alcohol use: Not Currently    Comment: may have a drink on Holidays  . Drug use: Not on file  . Sexual activity: Not on file  Other Topics Concern  . Not on file  Social History Narrative  .  Not on file   Social Determinants of Health   Financial Resource Strain:   . Difficulty of Paying Living Expenses: Not on file  Food Insecurity:   . Worried About Charity fundraiser in the Last Year: Not on file  . Ran Out of Food in the Last Year: Not on file  Transportation Needs:   . Lack of Transportation (Medical): Not on file  . Lack of Transportation (Non-Medical): Not on file  Physical Activity:   . Days of Exercise per Week: Not on file  . Minutes of Exercise per Session: Not on file  Stress:   . Feeling of Stress : Not on file  Social Connections:   . Frequency of Communication with Friends and Family: Not on file  . Frequency of Social Gatherings with Friends and Family: Not on file  . Attends Religious Services: Not on file  . Active Member of Clubs or Organizations: Not on file  . Attends Archivist Meetings: Not on file  . Marital Status: Not on file  Intimate Partner Violence:   . Fear of Current or Ex-Partner: Not on file  . Emotionally Abused: Not on file  . Physically Abused: Not on file  . Sexually Abused: Not on file    Review of Systems: See HPI, otherwise negative ROS  Physical Exam:  BP (!) 116/97   Pulse (!) 106   Temp 98.1 F (36.7 C) (Temporal)   Ht 5\' 10"  (1.778 m)   Wt 69.4 kg   SpO2 99%   BMI 21.95 kg/m  General:   Alert,  pleasant and cooperative in NAD Head:  Normocephalic and atraumatic. Neck:  Supple; no masses or thyromegaly. Lungs:  Clear throughout to auscultation.    Heart:  Regular rate and rhythm. Abdomen:  Soft, nontender and nondistended. Normal bowel sounds, without guarding, and without rebound.   Neurologic:  Alert and  oriented x4;  grossly normal neurologically.  Impression/Plan: is here for an endoscopy and colonoscopy to be performed for abdominal pain, nausea, vomiting, diarrhea  Risks, benefits, limitations, and alternatives regarding  endoscopy and colonoscopy have been reviewed with  the patient.  Questions have been answered.  All parties agreeable.   Wilburn Cornelia, MD  11/20/2019, 1:34 PM

## 2019-11-20 NOTE — Op Note (Signed)
[facility] Gastroenterology Patient Name: Roger Golden Procedure Date: 11/20/2019 3:54 PM MRN: [id number] Account #: [id number] Date of Birth: 1986-06-08 Admit Type: Outpatient Age: 34 Room: [facility] OR ROOM 01 Gender: Male Note Status: Finalized Procedure:             Colonoscopy Indications:           Periumbilical abdominal pain, Clinically significant                         diarrhea of unexplained origin, Weight loss Providers:             Lin Landsman MD, MD Medicines:             Monitored Anesthesia Care Complications:         No immediate complications. Estimated blood loss: None. Procedure:             Pre-Anesthesia Assessment:                        - Prior to the procedure, a History and Physical was                         performed, and patient medications and allergies were                         reviewed. The patient is competent. The risks and                         benefits of the procedure and the sedation options and                         risks were discussed with the patient. All questions                         were answered and informed consent was obtained.                         Patient identification and proposed procedure were                         verified by the physician, the nurse, the                         anesthesiologist, the anesthetist and the technician                         in the pre-procedure area in the procedure room in the                         endoscopy suite. Mental Status Examination: alert and                         oriented. Airway Examination: normal oropharyngeal                         airway and neck mobility. Respiratory Examination:                         clear to auscultation. CV Examination: normal.  Prophylactic Antibiotics: The patient does not require                         prophylactic antibiotics. Prior Anticoagulants: The                         patient has  taken no previous anticoagulant or                         antiplatelet agents. ASA Grade Assessment: II - A                         patient with mild systemic disease. After reviewing                         the risks and benefits, the patient was deemed in                         satisfactory condition to undergo the procedure. The                         anesthesia plan was to use monitored anesthesia care                         (MAC). Immediately prior to administration of                         medications, the patient was re-assessed for adequacy                         to receive sedatives. The heart rate, respiratory                         rate, oxygen saturations, blood pressure, adequacy of                         pulmonary ventilation, and response to care were                         monitored throughout the procedure. The physical                         status of the patient was re-assessed after the                         procedure.                        After obtaining informed consent, the colonoscope was                         passed under direct vision. Throughout the procedure,                         the patient's blood pressure, pulse, and oxygen                         saturations were monitored continuously. The  Colonoscope was introduced through the anus and                         advanced to the the terminal ileum, with                         identification of the appendiceal orifice and IC                         valve. The colonoscopy was performed without                         difficulty. The patient tolerated the procedure well.                         The quality of the bowel preparation was evaluated                         using the BBPS [facility] Bowel Preparation Scale) with                         scores of: Right Colon = 3, Transverse Colon = 3 and                         Left Colon = 3 (entire mucosa seen well with no                          residual staining, small fragments of stool or opaque                         liquid). The total BBPS score equals 9. Findings:      Hemorrhoids were found on perianal exam.      The terminal ileum appeared normal.      The entire examined colon appeared normal.      Non-bleeding external hemorrhoids were found during retroflexion. The       hemorrhoids were Grade III (internal hemorrhoids that prolapse but       require manual reduction). Impression:            - Hemorrhoids found on perianal exam.                        - The examined portion of the ileum was normal.                        - The entire examined colon is normal.                        - Non-bleeding external hemorrhoids.                        - No specimens collected. Recommendation:        - Discharge patient to home (with escort).                        - Resume regular diet today.                        - Return to  my office as previously scheduled. Procedure Code(s):     --- Professional ---                        [phone], Colonoscopy, flexible; diagnostic, including                         collection of specimen(s) by brushing or washing, when                         performed (separate procedure) Diagnosis Code(s):     --- Professional ---                        V49.4, Third degree hemorrhoids                        K64.4, Residual hemorrhoidal skin tags                        W96.75, Periumbilical pain                        R19.7, Diarrhea, unspecified                        R63.4, Abnormal weight loss CPT copyright 2019 American Medical Association. All rights reserved. The codes documented in this report are preliminary and upon coder review may  be revised to meet current compliance requirements. Dr. Ulyess Mort Lin Landsman MD, MD 11/20/2019 4:29:00 PM This report has been signed electronically. Number of Addenda: 0 Note Initiated On: 11/20/2019 3:54 PM Scope Withdrawal Time:  0 hours 5 minutes 28 seconds  Total Procedure Duration: 0 hours 13 minutes 59 seconds  Estimated Blood Loss:  Estimated blood loss: none.      [facility]

## 2019-11-20 NOTE — Anesthesia Preprocedure Evaluation (Signed)
Anesthesia Evaluation  Patient identified by MRN, date of birth, ID band Patient awake    Reviewed: Allergy & Precautions, H&P , NPO status , Patient's Chart, lab work & pertinent test results, reviewed documented beta blocker date and time   Airway Mallampati: II  TM Distance: >3 FB Neck ROM: full    Dental no notable dental hx.    Pulmonary neg pulmonary ROS, former smoker,    Pulmonary exam normal breath sounds clear to auscultation       Cardiovascular Exercise Tolerance: Good negative cardio ROS   Rhythm:regular Rate:Normal     Neuro/Psych Retrolisthesis L4-5 negative neurological ROS  negative psych ROS   GI/Hepatic negative GI ROS, Neg liver ROS,   Endo/Other  negative endocrine ROS  Renal/GU negative Renal ROS  negative genitourinary   Musculoskeletal   Abdominal   Peds  Hematology negative hematology ROS (+)   Anesthesia Other Findings   Reproductive/Obstetrics negative OB ROS                             Anesthesia Physical Anesthesia Plan  ASA: II  Anesthesia Plan: General   Post-op Pain Management:    Induction:   PONV Risk Score and Plan: 2 and Propofol infusion and TIVA  Airway Management Planned:   Additional Equipment:   Intra-op Plan:   Post-operative Plan:   Informed Consent: I have reviewed the patients History and Physical, chart, labs and discussed the procedure including the risks, benefits and alternatives for the proposed anesthesia with the patient or authorized representative who has indicated his/her understanding and acceptance.     Dental Advisory Given  Plan Discussed with: CRNA  Anesthesia Plan Comments:         Anesthesia Quick Evaluation

## 2019-11-20 NOTE — Anesthesia Postprocedure Evaluation (Signed)
Anesthesia Post Note  Patient: Roger Golden  Procedure(s) Performed: COLONOSCOPY WITH PROPOFOL (N/A Rectum) ESOPHAGOGASTRODUODENOSCOPY (EGD) WITH PROPOFOL (N/A Throat) BIOPSY (N/A Throat)     Patient location during evaluation: PACU Anesthesia Type: General Level of consciousness: awake and alert Pain management: pain level controlled Vital Signs Assessment: post-procedure vital signs reviewed and stable Respiratory status: spontaneous breathing, nonlabored ventilation, respiratory function stable and patient connected to nasal cannula oxygen Cardiovascular status: blood pressure returned to baseline and stable Postop Assessment: no apparent nausea or vomiting Anesthetic complications: no    Scarlette Slice

## 2019-11-21 ENCOUNTER — Encounter: Payer: Self-pay | Admitting: Gastroenterology

## 2019-11-21 ENCOUNTER — Encounter: Payer: Self-pay | Admitting: *Deleted

## 2019-11-21 ENCOUNTER — Ambulatory Visit: Payer: Self-pay | Admitting: Gastroenterology

## 2019-11-22 ENCOUNTER — Encounter: Payer: Self-pay | Admitting: Gastroenterology

## 2019-11-22 ENCOUNTER — Ambulatory Visit (INDEPENDENT_AMBULATORY_CARE_PROVIDER_SITE_OTHER): Payer: PRIVATE HEALTH INSURANCE | Admitting: Gastroenterology

## 2019-11-22 ENCOUNTER — Other Ambulatory Visit: Payer: Self-pay

## 2019-11-22 DIAGNOSIS — R112 Nausea with vomiting, unspecified: Secondary | ICD-10-CM | POA: Diagnosis not present

## 2019-11-22 DIAGNOSIS — R197 Diarrhea, unspecified: Secondary | ICD-10-CM

## 2019-11-22 DIAGNOSIS — R1010 Upper abdominal pain, unspecified: Secondary | ICD-10-CM | POA: Diagnosis not present

## 2019-11-22 DIAGNOSIS — A045 Campylobacter enteritis: Secondary | ICD-10-CM

## 2019-11-22 DIAGNOSIS — A09 Infectious gastroenteritis and colitis, unspecified: Secondary | ICD-10-CM | POA: Diagnosis not present

## 2019-11-22 LAB — SURGICAL PATHOLOGY

## 2019-11-22 NOTE — Progress Notes (Addendum)
Lannette Donath, MD [address]  [address]  [place], [place] [address]  Main: [phone]  Fax: [phone]    Gastroenterology Consultation Video Visit  Referring Provider:     Rayetta Humphrey, MD Primary Care Physician:  Rayetta Humphrey, MD Primary Gastroenterologist:  Dr. Arlyss Repress Reason for Consultation:   Abdominal pain, nausea, vomiting, diarrhea        HPI:   Roger Golden is a 34 y.o. male referred by Dr. Rayetta Humphrey, MD  for consultation & management of Abdominal pain, nausea, vomiting, diarrhea  Virtual Visit Video Note  I connected with Roger Golden on 11/26/19 at 11:00 AM EST by video and verified that I am speaking with the correct person using two identifiers.   I discussed the limitations, risks, security and privacy concerns of performing an evaluation and management service by video and the availability of in person appointments. I also discussed with the patient that there may be a patient responsible charge related to this service. The patient expressed understanding and agreed to proceed.  Location of the Patient: Home   Location of the provider: Office   Persons participating in the visit: Patient and provided only   History of Present Illness: Roger Golden was originally seen on 11/13/2019 for evaluation of upper abdominal pain, nausea, vomiting and nonbloody diarrhea.  He CT abdomen pelvis with contrast was negative.  I have ordered stool studies to evaluate for infection which were not done yet.  He underwent bidirectional endoscopy which was negative.  Biopsies were unremarkable as well.  Patient sent message on my chart that he is experiencing nausea, vomiting, sulfur burps and diarrhea, nonbloody.  He is also requesting for FMLA.  CRP was negative.  I started him on Bentyl and Zofran which he has not taken yet   NSAIDs: None  Antiplts/Anticoagulants/Anti thrombotics: None  GI Procedures: EGD and colonoscopy  11/20/2019 Unremarkable DIAGNOSIS:  A. DUODENUM; COLD BIOPSY:  - DUODENAL MUCOSA WITH INTACT VILLI.  - NEGATIVE FOR ACTIVE INFLAMMATION, INTRAEPITHELIAL LYMPHOCYTOSIS, AND  INFECTIOUS AGENTS.   B. STOMACH: RANDOM COLD BIOPSY:  - ANTRAL AND OXYNTIC MUCOSA WITHOUT SPECIFIC PATHOLOGIC CHANGES, SEE  COMMENT.  - NEGATIVE FOR H. PYLORI, INTESTINAL METAPLASIA, DYSPLASIA, AND  MALIGNANCY.   Past Medical History:  Diagnosis Date  . Retrolisthesis    L4-L5    Past Surgical History:  Procedure Laterality Date  . BIOPSY N/A 11/20/2019   Procedure: BIOPSY;  Surgeon: Toney Reil, MD;  Location: [facility];  Service: Endoscopy;  Laterality: N/A;  . COLONOSCOPY WITH PROPOFOL N/A 11/20/2019   Procedure: COLONOSCOPY WITH PROPOFOL;  Surgeon: Toney Reil, MD;  Location: [facility];  Service: Endoscopy;  Laterality: N/A;  . ESOPHAGOGASTRODUODENOSCOPY (EGD) WITH PROPOFOL N/A 11/20/2019   Procedure: ESOPHAGOGASTRODUODENOSCOPY (EGD) WITH PROPOFOL;  Surgeon: Toney Reil, MD;  Location: [facility];  Service: Endoscopy;  Laterality: N/A;  . NOSE SURGERY       Current Outpatient Medications:  .  cyclobenzaprine (FLEXERIL) 10 MG tablet, Take 1 tablet (10 mg total) by mouth 3 (three) times daily as needed., Disp: 15 tablet, Rfl: 0 .  dicyclomine (BENTYL) 10 MG capsule, Take 1 capsule (10 mg total) by mouth 4 (four) times daily -  before meals and at bedtime., Disp: 30 capsule, Rfl: 0 .  diphenoxylate-atropine (LOMOTIL) 2.5-0.025 MG tablet, Take by mouth 4 (four) times daily as needed for diarrhea or loose stools., Disp: , Rfl:  .  ondansetron (ZOFRAN) 4 MG tablet, Take 1 tablet (4 mg total) by mouth every 8 (eight) hours as needed for nausea or vomiting., Disp: 30 tablet, Rfl: 0   History reviewed. No pertinent family history.   Social History   Tobacco Use  . Smoking status: Former Smoker    Packs/day: 0.25    Years: 10.00    Pack years: 2.50     Types: Cigarettes    Quit date: 10/04/2019    Years since quitting: 0.1  . Smokeless tobacco: Never Used  Substance Use Topics  . Alcohol use: Not Currently    Comment: may have a drink on Holidays  . Drug use: Not on file    Allergies as of 11/22/2019 - Review Complete 11/22/2019  Allergen Reaction Noted  . Amoxicillin Hives 06/17/2016    Imaging Studies: Reviewed  Assessment and Plan:   Roger Golden is a 34 y.o. male with history of tobacco use is seen in consultation for abdominal pain, nausea, vomiting, nonbloody diarrhea associated with some weight loss.  CBC revealed mild leukocytosis.  CRP normal.  Bidirectional endoscopy was negative.  Gastric and duodenal biopsies are normal.  Awaiting stool studies.    Recommend Zofran 4 mg before each meal 3-4 times daily Recommend Bentyl 10 mg before each meal and at bedtime Follow-up on stool studies   Follow Up Instructions:   I discussed the assessment and treatment plan with the patient. The patient was provided an opportunity to ask questions and all were answered. The patient agreed with the plan and demonstrated an understanding of the instructions.   The patient was advised to call back or seek an in-person evaluation if the symptoms worsen or if the condition fails to improve as anticipated.  I provided 12 minutes of face-to-face time during this encounter.   Follow up in 1 month   Cephas Darby, MD   Addendum note: 11/26/19  Called patient to discuss about the stool study results. Patient answered the phone. He has campylobacter infection based on the stool studies. Given the duration of his symptoms, recommend to treat with azithromycin 500mg  daily for 3 days. He agreed His girl friend then requested to remove marjuana use from his chart. She clarified that he does not smoke marjuana but vapes nicotine. Marjuana use will be removed from his chart as documented in my earlier notes. She also requested FMLA as he has  been out for 3 weeks due to GI symptoms. I told her that I will get back to him about it Prescription sent to his pharmacy  Cephas Darby, [address]  [place], [place] [address]  Main: [phone]  Fax: [phone] Pager: [phone]

## 2019-11-23 ENCOUNTER — Encounter: Payer: Self-pay | Admitting: Gastroenterology

## 2019-11-26 ENCOUNTER — Encounter: Payer: Self-pay | Admitting: Gastroenterology

## 2019-11-26 MED ORDER — AZITHROMYCIN 500 MG PO TABS: 500.0000 mg | ORAL_TABLET | Freq: Every day | ORAL | 0 refills | Status: AC

## 2019-11-26 NOTE — Addendum Note (Signed)
Addended by: Arlyss Repress on: 11/26/2019 12:32 PM   Modules accepted: Orders

## 2019-11-30 LAB — GI PROFILE, STOOL, PCR

## 2019-11-30 LAB — CALPROTECTIN, FECAL: Calprotectin, Fecal: 194 ug/g — ABNORMAL HIGH (ref 0–120)

## 2019-12-17 ENCOUNTER — Ambulatory Visit: Payer: Self-pay | Admitting: Gastroenterology

## 2019-12-21 ENCOUNTER — Other Ambulatory Visit: Payer: Self-pay

## 2019-12-26 ENCOUNTER — Other Ambulatory Visit: Payer: Self-pay

## 2019-12-26 ENCOUNTER — Ambulatory Visit (INDEPENDENT_AMBULATORY_CARE_PROVIDER_SITE_OTHER): Payer: PRIVATE HEALTH INSURANCE | Admitting: Gastroenterology

## 2019-12-26 ENCOUNTER — Encounter: Payer: Self-pay | Admitting: Gastroenterology

## 2019-12-26 VITALS — BP 144/86 | HR 98 | Temp 98.7°F | Ht 70.0 in | Wt 158.1 lb

## 2019-12-26 DIAGNOSIS — A045 Campylobacter enteritis: Secondary | ICD-10-CM

## 2019-12-26 DIAGNOSIS — K644 Residual hemorrhoidal skin tags: Secondary | ICD-10-CM

## 2019-12-26 DIAGNOSIS — K641 Second degree hemorrhoids: Secondary | ICD-10-CM | POA: Diagnosis not present

## 2019-12-26 NOTE — Progress Notes (Signed)
Roger Darby, MD [address]  [place]  [place], [place] [address]  Main: [phone]  Fax: [phone]    Gastroenterology Consultation  Referring Provider:     Sharyne Peach, MD Primary Care Physician:  Sharyne Peach, MD Primary Gastroenterologist:  Dr. Cephas Golden Reason for Consultation: Nausea, vomiting, abdominal pain, diarrhea        HPI:   Roger Golden is a 34 y.o. male referred from ER for consultation & management of acute onset cough nausea, vomiting and abdominal pain Patient presented to ER on 11/11/2019 secondary to approximately 2 weeks history of upper/periumbilical abdominal pain, associated with nausea, vomiting and diarrhea.  Abdominal pain is mostly central associated with nonbloody nonbilious emesis,.  His diarrheal episodes were several daily.  He denies alcohol, tobacco and marijuana use.  His labs revealed mild leukocytosis, normal CMP and lipase.  CT abdomen and pelvis with contrast was unremarkable.  Patient was advised to try over-the-counter probiotics, antidiarrheals with dietary adjustment and referred to GI for further evaluation.  COVID-19 test came back negative.  Since patient was discharged from the ER, he did not have any bowel movement.  He does report feeling gassy, bloating, symptoms trigger with spicy foods.  He does feel nauseous.  He also lost about 11 pounds in last 2 weeks as he is not able to tolerate regular food.  He does eat outside about 2-3 times a week His mother had Helicobacter pylori infection, ? IBD Patient denies smoking or alcohol use He works as an Actor  Follow-up video visit 11/22/2019 Roger Golden was originally seen on 11/13/2019 for evaluation of upper abdominal pain, nausea, vomiting and nonbloody diarrhea.  He CT abdomen pelvis with contrast was negative.  I have ordered stool studies to evaluate for infection which were not done yet.  He underwent bidirectional endoscopy which was negative.   Biopsies were unremarkable as well.  Patient sent message on my chart that he is experiencing nausea, vomiting, sulfur burps and diarrhea, nonbloody.  He is also requesting for FMLA.  CRP was negative.  I started him on Bentyl and Zofran which he has not taken yet  Follow-up visit 12/26/2019 Patient with stool studies back positive for Campylobacter infection.  Due to severity of symptoms, he was treated with 3 days of azithromycin.  His symptoms have completely resolved.  He has gained about 10 pounds since relief of his symptoms.  Elevated fecal calprotectin levels are secondary to infectious etiology.  He reports mild abdominal cramping after eating but this does not limit his p.o. intake.  He is currently not taking medications.  Patient started going back to work.  He is concerned about blood on wiping intermittently and he thinks it is from hemorrhoids.  He reports having regular bowel movements.  He denies any other hemorrhoidal symptoms.  NSAIDs: None  Antiplts/Anticoagulants/Anti thrombotics: None  GI Procedures:  EGD and colonoscopy 11/20/2019  - Normal esophagus and gastroesophageal junction. - Normal stomach. Biopsied. - Normal examined duodenum. Biopsied.  - Hemorrhoids found on perianal exam. - The examined portion of the ileum was normal. - The entire examined colon is normal. - Non-bleeding external hemorrhoids. - No specimens collected.  DIAGNOSIS:  A. DUODENUM; COLD BIOPSY:  - DUODENAL MUCOSA WITH INTACT VILLI.  - NEGATIVE FOR ACTIVE INFLAMMATION, INTRAEPITHELIAL LYMPHOCYTOSIS, AND  INFECTIOUS AGENTS.   B. STOMACH: RANDOM COLD BIOPSY:  - ANTRAL AND OXYNTIC MUCOSA WITHOUT SPECIFIC PATHOLOGIC CHANGES, SEE  COMMENT.  - NEGATIVE  FOR H. PYLORI, INTESTINAL METAPLASIA, DYSPLASIA, AND  MALIGNANCY.    Past Medical History:  Diagnosis Date  . Retrolisthesis    L4-L5    Past Surgical History:  Procedure Laterality Date  . BIOPSY N/A 11/20/2019   Procedure: BIOPSY;   Surgeon: Toney Reil, MD;  Location: [facility];  Service: Endoscopy;  Laterality: N/A;  . COLONOSCOPY WITH PROPOFOL N/A 11/20/2019   Procedure: COLONOSCOPY WITH PROPOFOL;  Surgeon: Toney Reil, MD;  Location: [facility];  Service: Endoscopy;  Laterality: N/A;  . ESOPHAGOGASTRODUODENOSCOPY (EGD) WITH PROPOFOL N/A 11/20/2019   Procedure: ESOPHAGOGASTRODUODENOSCOPY (EGD) WITH PROPOFOL;  Surgeon: Toney Reil, MD;  Location: [facility];  Service: Endoscopy;  Laterality: N/A;  . NOSE SURGERY      No current outpatient medications on file.   History reviewed. No pertinent family history.   Social History   Tobacco Use  . Smoking status: Former Smoker    Packs/day: 0.25    Years: 10.00    Pack years: 2.50    Types: Cigarettes    Quit date: 10/04/2019    Years since quitting: 0.2  . Smokeless tobacco: Never Used  Substance Use Topics  . Alcohol use: Not Currently    Comment: may have a drink on Holidays  . Drug use: Not on file    Allergies as of 12/26/2019 - Review Complete 12/26/2019  Allergen Reaction Noted  . Amoxicillin Hives 06/17/2016    Review of Systems:    All systems reviewed and negative except where noted in HPI.   Physical Exam:  BP (!) 144/86 (BP Location: Left Arm, Patient Position: Sitting, Cuff Size: Normal)   Pulse 98   Temp 98.7 F (37.1 C) (Oral)   Ht 5\' 10"  (1.778 m)   Wt 158 lb 2 oz (71.7 kg)   BMI 22.69 kg/m  No LMP for male patient.  General:   Alert, thin built, pleasant and cooperative in NAD Head:  Normocephalic and atraumatic. Eyes:  Sclera clear, no icterus.   Conjunctiva pink. Ears:  Normal auditory acuity. Nose:  No deformity, discharge, or lesions. Mouth:  No deformity or lesions,oropharynx pink & moist. Neck:  Supple; no masses or thyromegaly. Lungs:  Respirations even and unlabored.  Clear throughout to auscultation.   No wheezes, crackles, or rhonchi. No acute distress. Heart:   Regular rate and rhythm; no murmurs, clicks, rubs, or gallops. Abdomen:  Normal bowel sounds. Soft, non-tender and non-distended without masses, hepatosplenomegaly or hernias noted.  No guarding or rebound tenderness.   Rectal: Not performed Msk:  Symmetrical without gross deformities. Good, equal movement & strength bilaterally. Pulses:  Normal pulses noted. Extremities:  No clubbing or edema.  No cyanosis. Neurologic:  Alert and oriented x3;  grossly normal neurologically. Skin:  Intact without significant lesions or rashes. No jaundice. Psych:  Alert and cooperative. Normal mood and affect.  Imaging Studies: Reviewed  Assessment and Plan:   Roger Golden is a 34 y.o. male with no significant past medical history is seen for follow-up acute gastroenteritis secondary to Campylobacter infection after eating undercooked meat.  Patient is treated with 3 days course of azithromycin.  His symptoms have resolved.  Elevated fecal calprotectin levels are secondary to infectious diarrhea.  Patient has prolapsed external hemorrhoids not being on wiping only.  Advised him to try over-the-counter hydrocortisone cream per rectum 2 times a day for 2 weeks.  I will discuss with him about hemorrhoid ligation if his hemorrhoidal symptoms are persistent  after trial of steroid cream  Follow up as needed   Arlyss Repress, MD

## 2021-09-03 IMAGING — CR DG LUMBAR SPINE 2-3V
1 series · 3 of 3 positions shown · non-contrast
Comparison: None.

CLINICAL DATA: Back pain

EXAM:
LUMBAR SPINE - 2-3 VIEW

[Series 1: dg lumbar spine 2-3 views · 0.14mm/px · 3 of 3 slices shown]
[im 1/3]
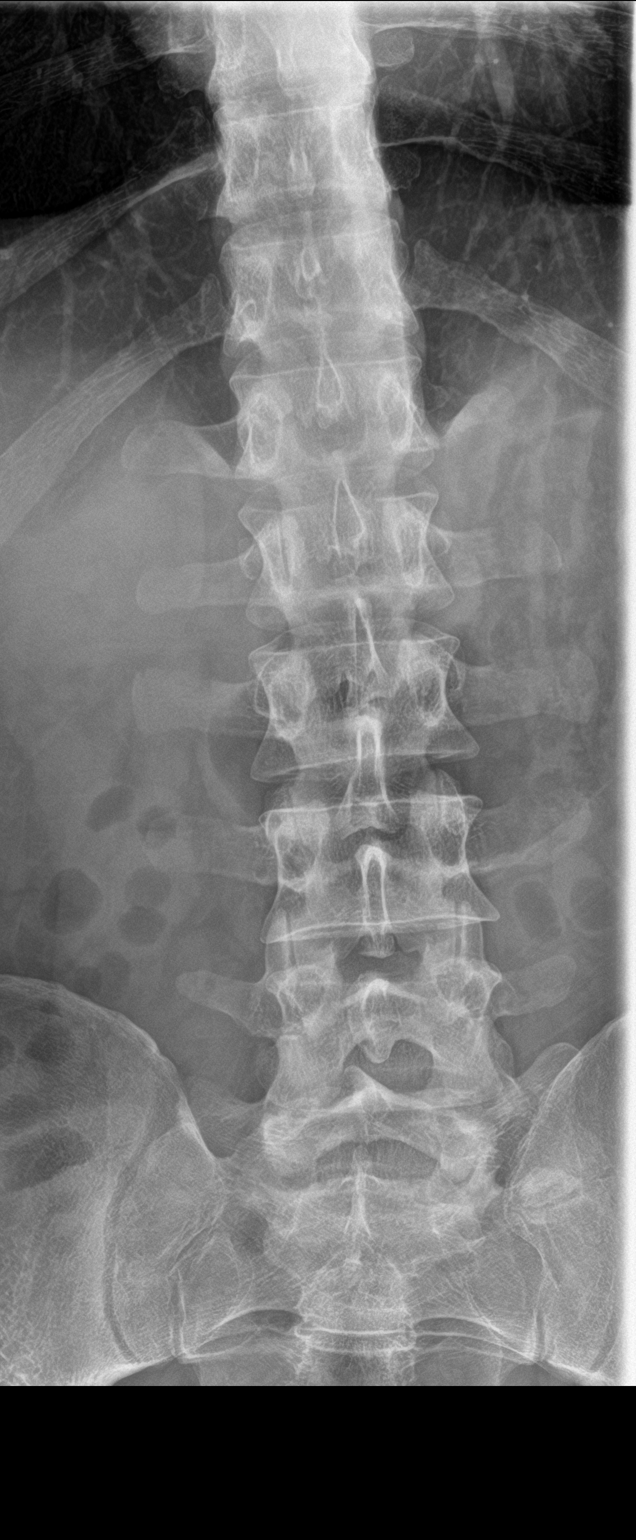
[im 2/3]
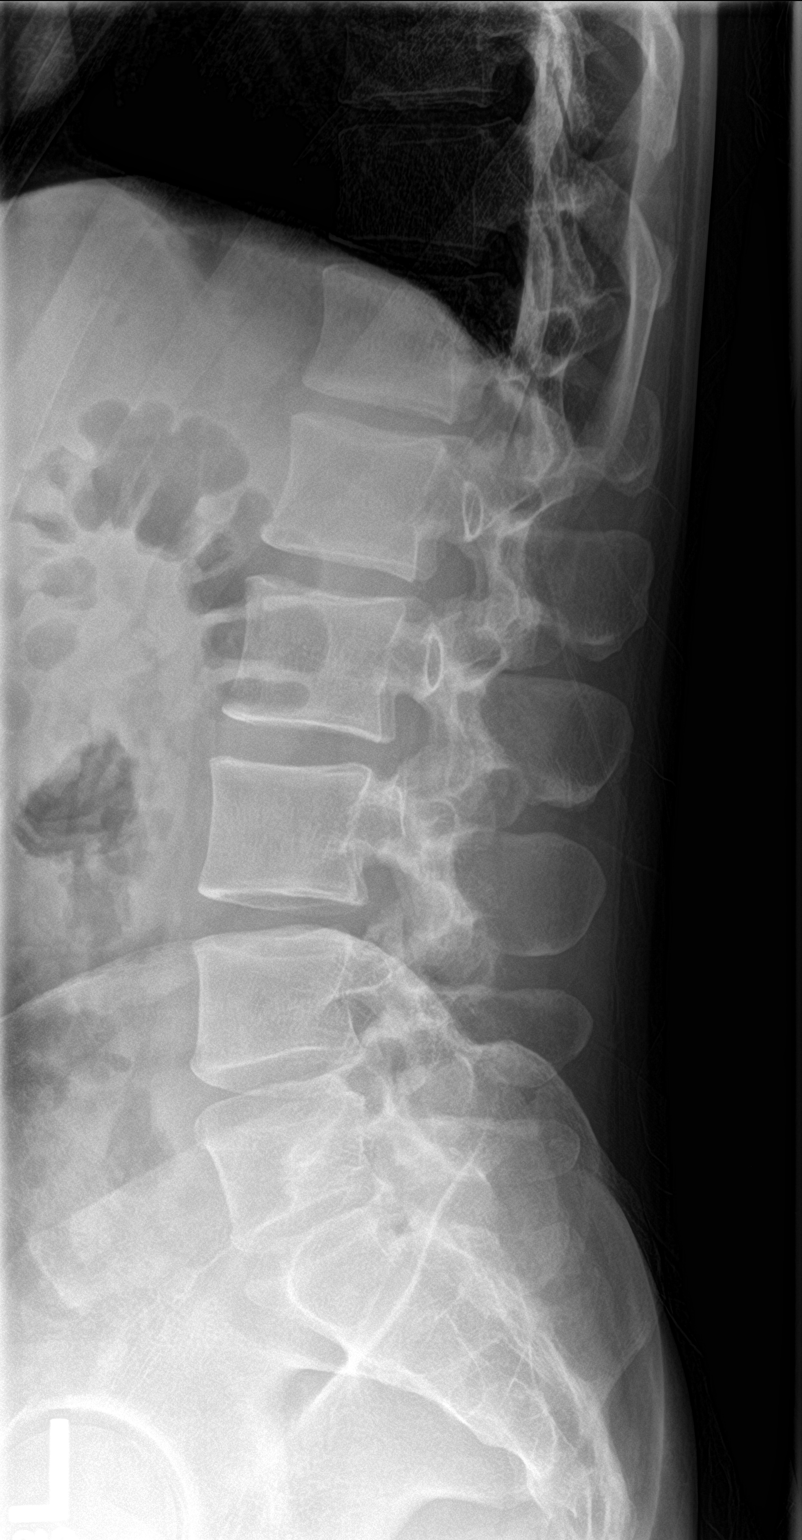
[im 3/3]
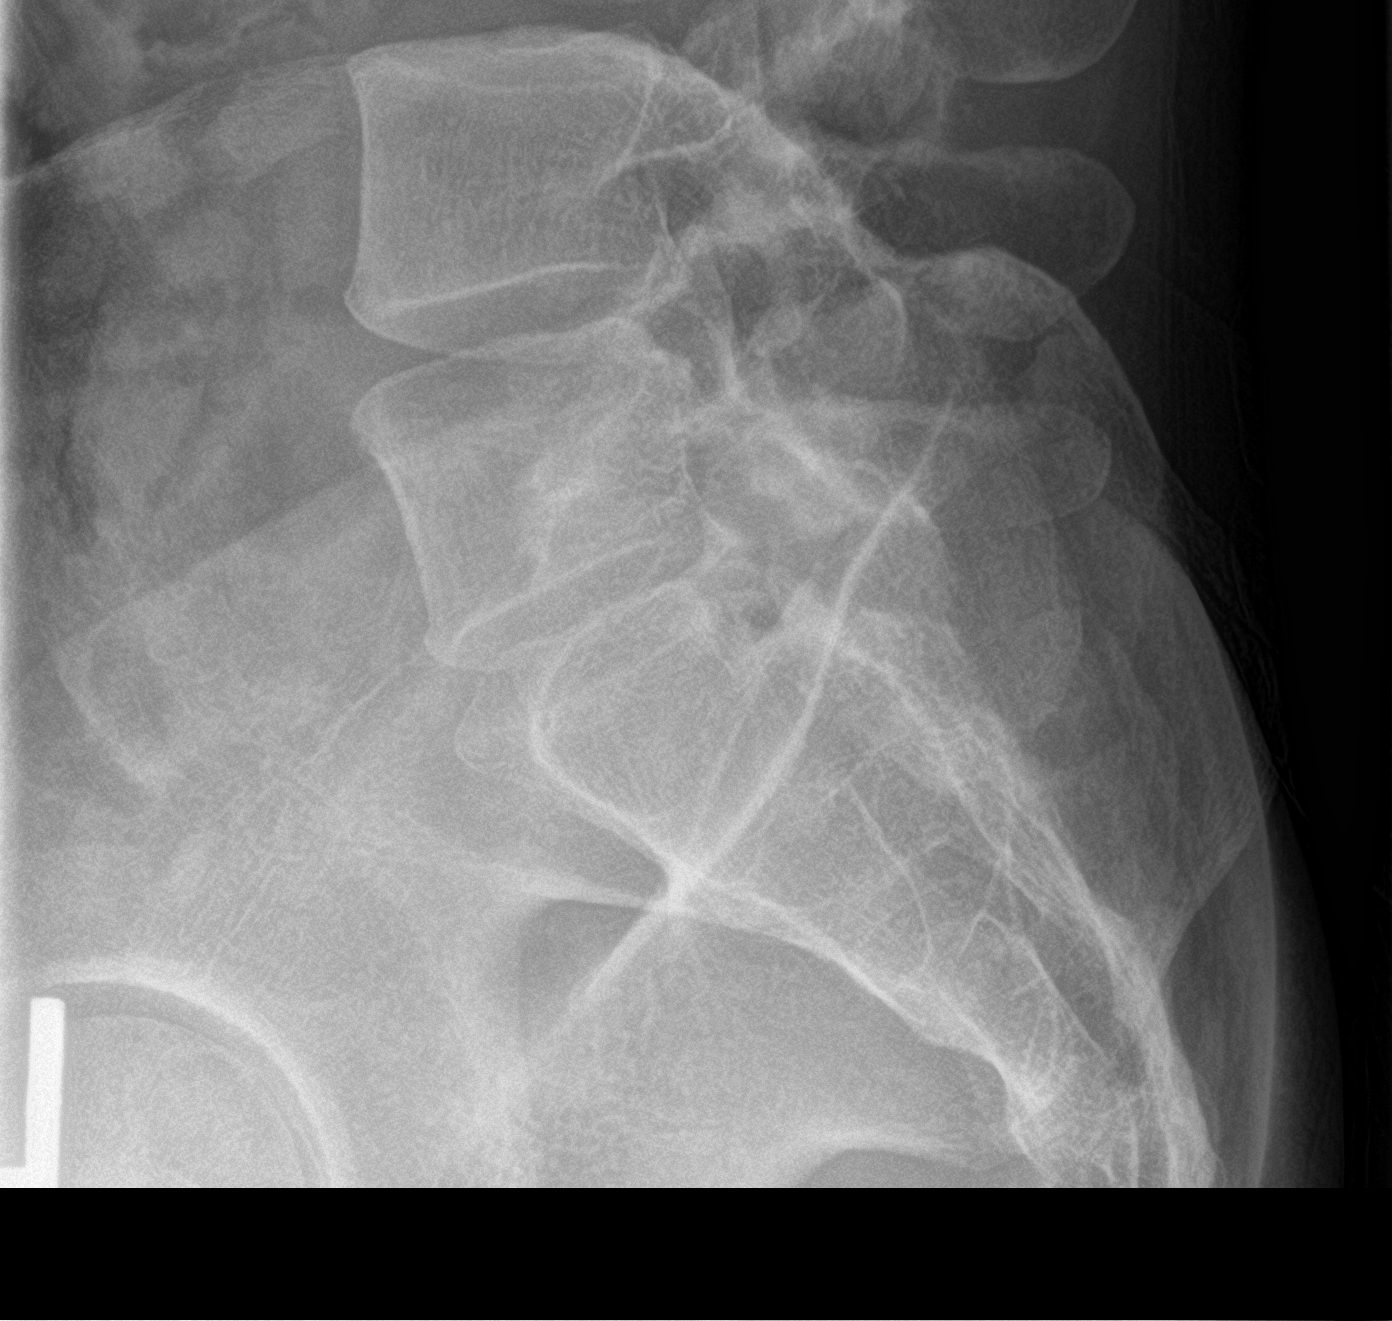

[3 of 3 positions shown; findings below may reference images not displayed]

FINDINGS: There is no evidence of lumbar spine fracture. Mild retrolisthesis
at L4-L5. No significant disc space narrowing or facet hypertrophy.
IMPRESSION: No acute osseous abnormality.

## 2021-09-05 IMAGING — CT CT ABD-PELV W/ CM
2 of 4 series · 16 of 46 positions shown, 18 images · IV contrast (APPLIED)
Comparison: 12/23/2010.

CLINICAL DATA: 33-year-old presenting with a 2 week history of
intermittent generalized abdominal pain, nausea, vomiting and
bloating.

EXAM:
CT ABDOMEN AND PELVIS WITH CONTRAST
TECHNIQUE: Multidetector CT imaging of the abdomen and pelvis was performed
using the standard protocol following bolus administration of
intravenous contrast.
CONTRAST:  100mL OMNIPAQUE IOHEXOL 300 MG/ML IV.

[Series 2: axial st · axial · 0.68mm/px · z∈[-567,-172]mm · 13 of 87 slices shown, 15 images]
[im 4/87  soft-tissue]
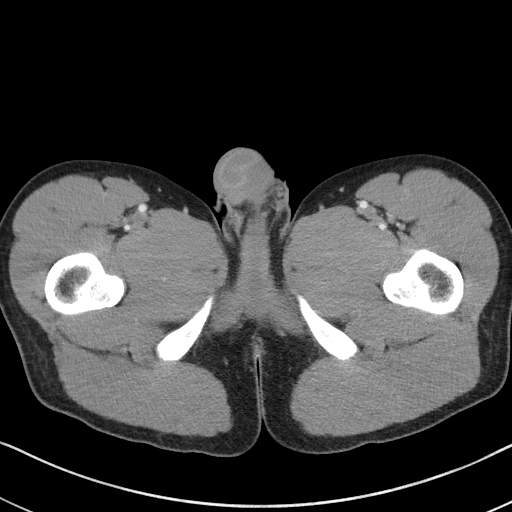
[im 4/87  bone]
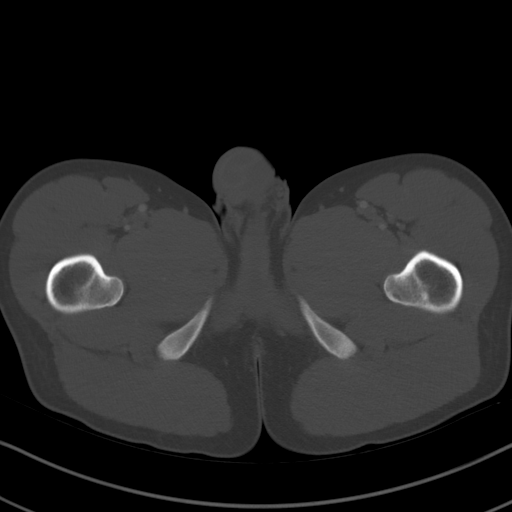
[im 10/87  soft-tissue]
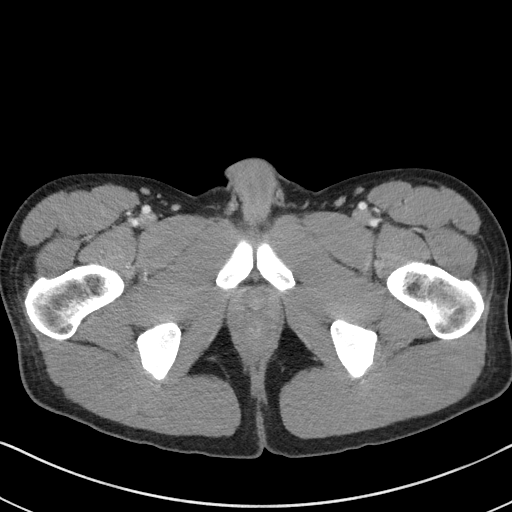
[im 17/87  soft-tissue]
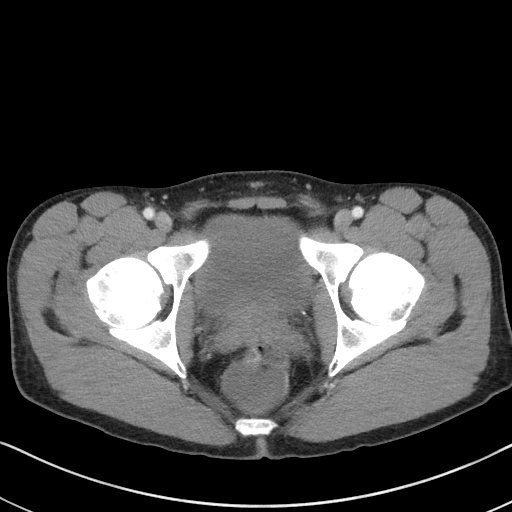
[im 24/87  soft-tissue]
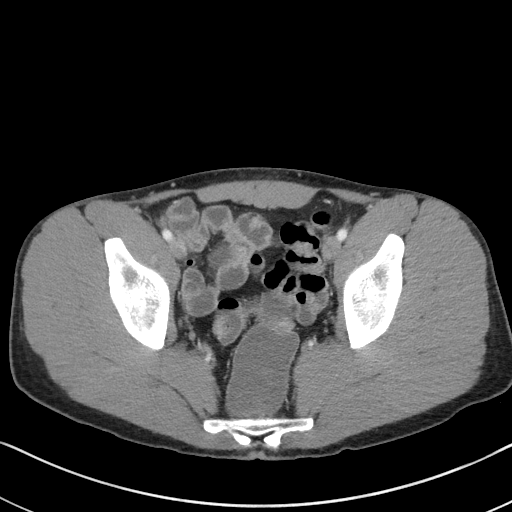
[im 30/87  soft-tissue]
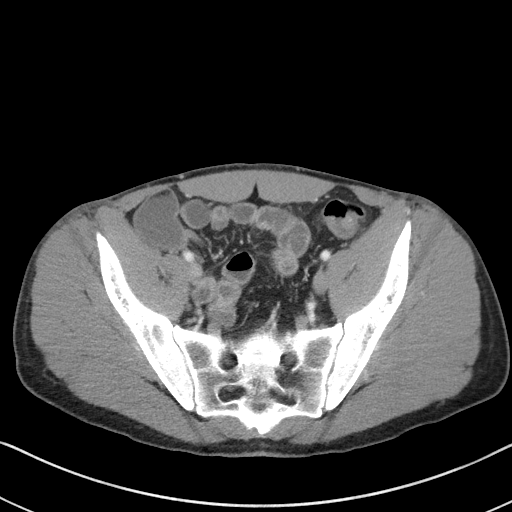
[im 37/87  soft-tissue]
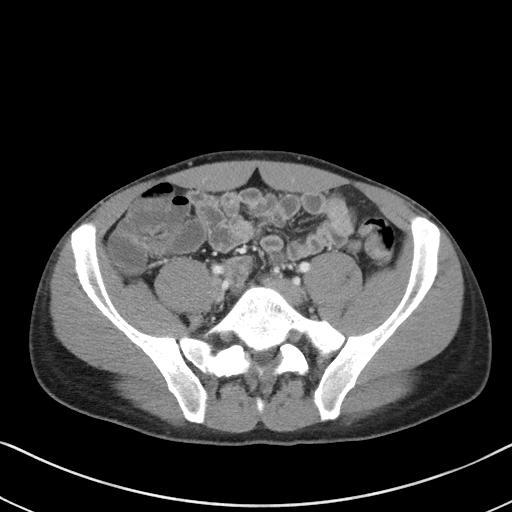
[im 44/87  soft-tissue]
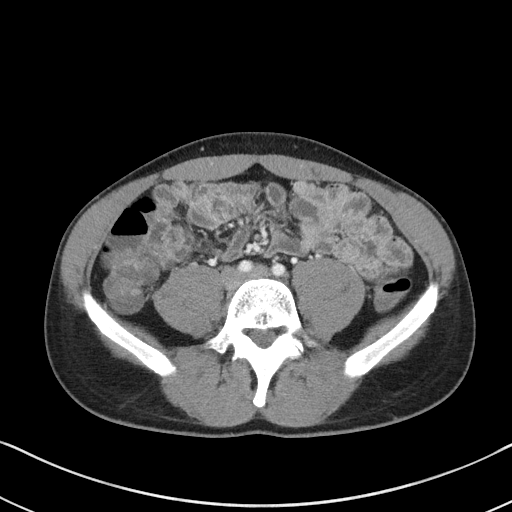
[im 50/87  soft-tissue]
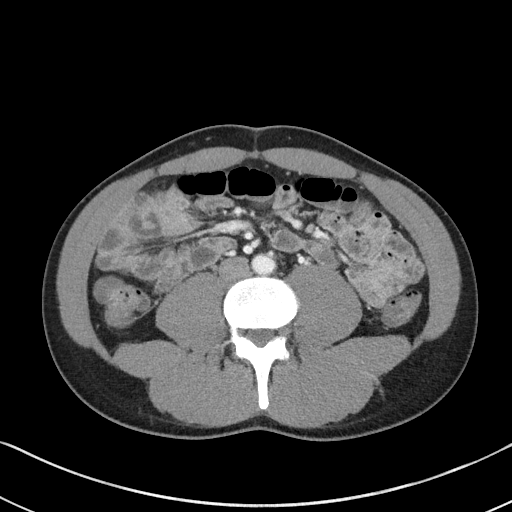
[im 57/87  soft-tissue]
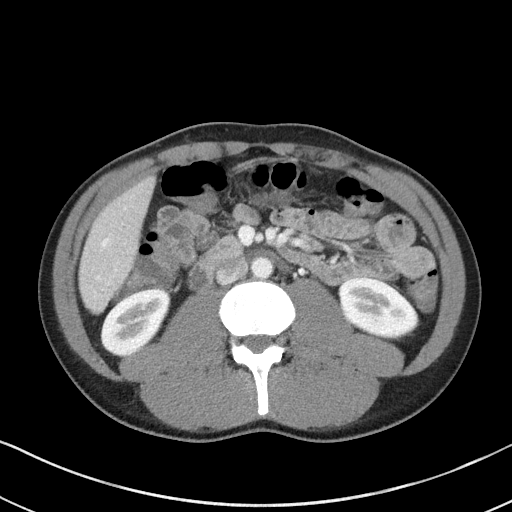
[im 57/87  bone]
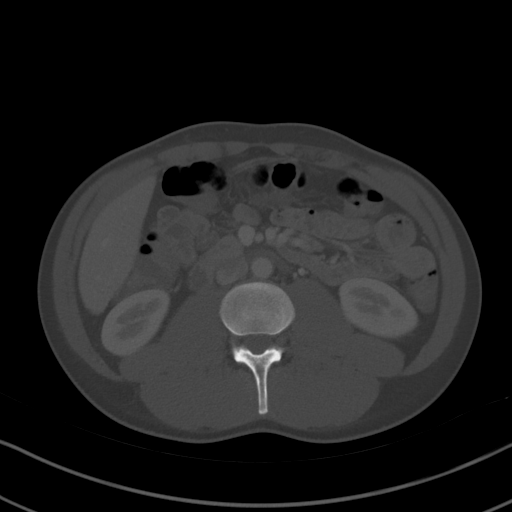
[im 63/87  soft-tissue]
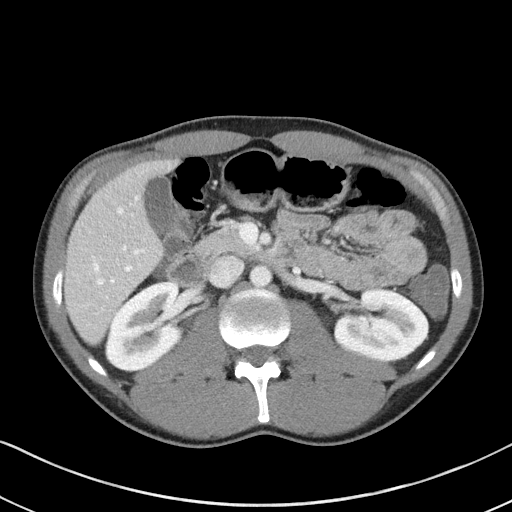
[im 70/87  soft-tissue]
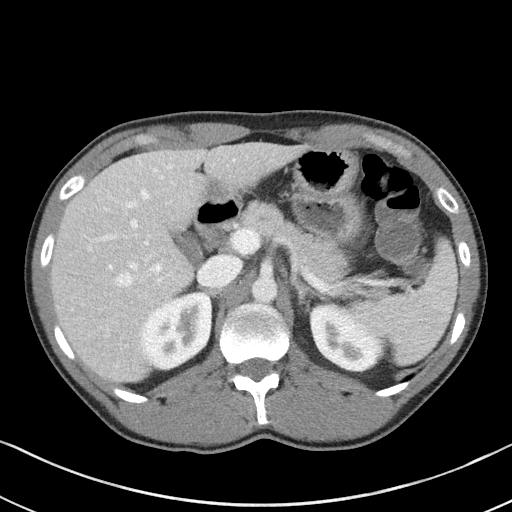
[im 77/87  soft-tissue]
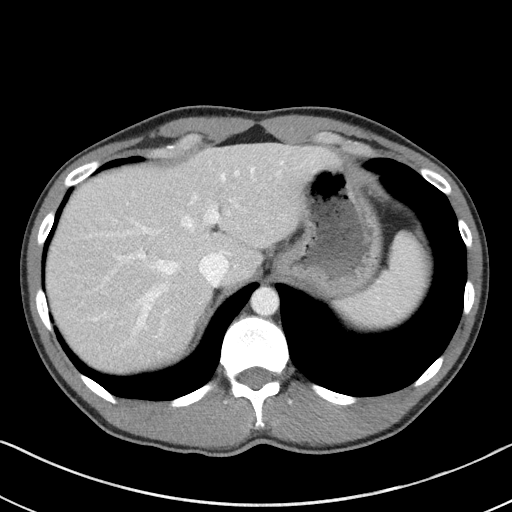
[im 83/87  soft-tissue]
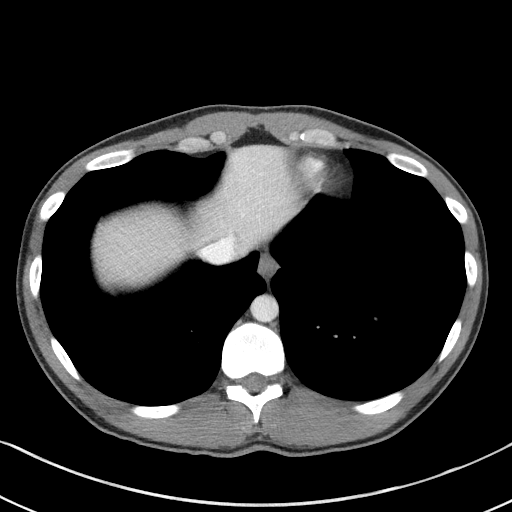

[Series 5: coronal st · coronal · 0.66mm/px · 3 of 101 slices shown]
[im 34/101  soft-tissue]
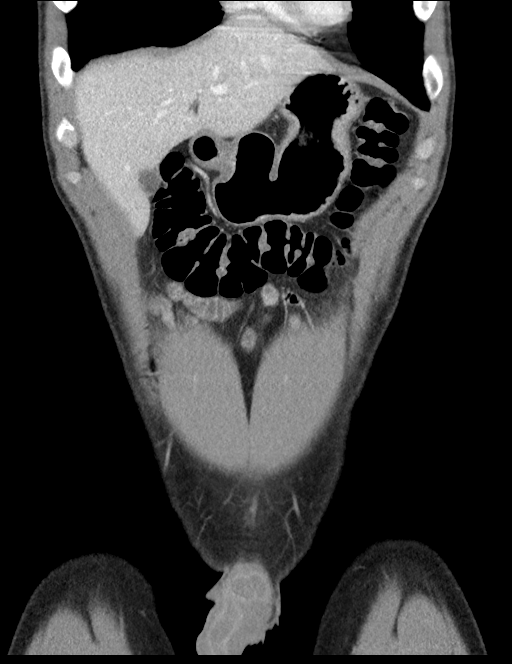
[im 45/101  soft-tissue]
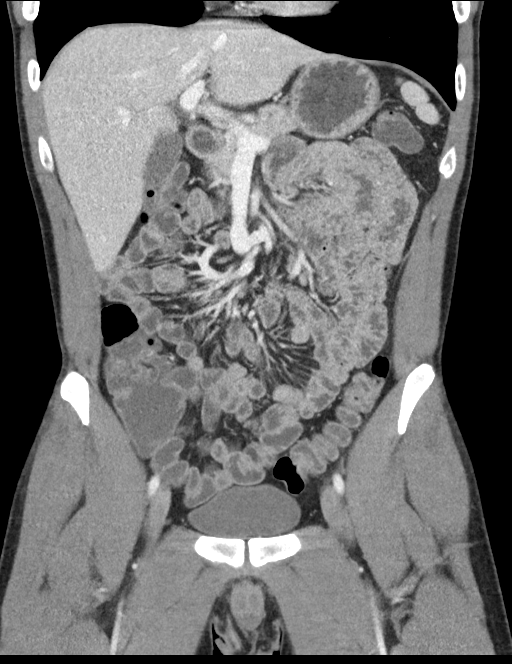
[im 56/101  soft-tissue]
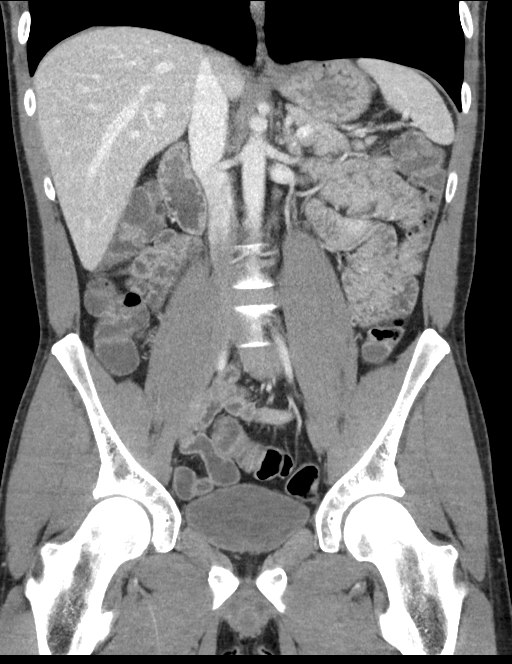

[16 of 46 positions shown; findings below may reference images not displayed]

FINDINGS: Lower chest: Heart size normal.  Visualized lung bases clear.

Hepatobiliary: Liver normal in size and appearance. Gallbladder
normal in appearance without calcified gallstones. No biliary ductal
dilation.

Pancreas: Normal in appearance without evidence of mass, ductal
dilation, or inflammation.

Spleen: Normal in size and appearance.

Adrenals/Urinary Tract: Normal appearing adrenal glands. Kidneys
normal in size and appearance without focal parenchymal abnormality.
No hydronephrosis. No evidence of urinary tract calculi. Normal
appearing urinary bladder.

Stomach/Bowel: Stomach normal in appearance for the degree of
distention. Normal-appearing small bowel. Liquid stool throughout
normal appearing colon from cecum to rectum. No focal abnormality
involving the colon. Normal appendix in the RIGHT mid pelvis.

Vascular/Lymphatic: Minimal atherosclerosis involving the LEFT
common iliac artery at its origin with a solitary calcified plaque.
No evidence of atherosclerosis elsewhere. Normal-appearing portal
venous and systemic venous systems.

No pathologic lymphadenopathy.

Reproductive: Normal sized prostate gland containing calcifications.
Normal seminal vesicles.

Other: None.

Musculoskeletal: Mild disc space narrowing and endplate hypertrophic
changes at L5-S1. No acute findings.
IMPRESSION: 1. No acute abnormalities involving the abdomen or pelvis.
2. Minimal atherosclerosis involving the LEFT common iliac artery at
its origin, advanced for patient age.

## 2023-02-14 ENCOUNTER — Telehealth: Payer: Self-pay

## 2023-02-14 NOTE — Telephone Encounter (Signed)
error
